# Patient Record
Sex: Female | Born: 1995 | Race: Black or African American | Hispanic: No | Marital: Single | Smoking: Former smoker
Health system: Southern US, Community
[De-identification: ages and names within clinical notes are randomized; demographics above are authoritative.]

## PROBLEM LIST (undated history)

## (undated) DIAGNOSIS — A749 Chlamydial infection, unspecified: Secondary | ICD-10-CM

## (undated) HISTORY — PX: OTHER SURGICAL HISTORY: SHX169

## (undated) HISTORY — DX: Chlamydial infection, unspecified: A74.9

---

## 2017-04-17 ENCOUNTER — Encounter: Payer: Self-pay | Admitting: *Deleted

## 2017-04-18 NOTE — L&D Delivery Note (Signed)
Patient: Rita ShanksDearika Whitsel MRN: [id number]  GBS status: negative  Patient is a 22 y.o. now G1P1001 s/p NSVD at 6371w5d, who was admitted for IOL for mild polyhydramnios and postdates. S/p IOL w/ misoprostol, FB and Pitocin. SROM 12h 1031m prior to delivery with clear fluid.   Delivery Note At 4:33 AM a viable female was delivered via Vaginal, Spontaneous (Presentation: vertex; OA).   APGAR: 4, 9 Weight: 3841g (8lb 7.5oz)   Placenta status: intact, sent to patthology.   Cord:  3-vessel. Cord pH: sent Complications: 60-second shoulder dystocia .    Anesthesia:  Epidural Episiotomy: None Lacerations:  None Suture Repair: none Est. Blood Loss (mL):  200  Mother complete and pushing. Head difficult to deliver despite good maternal effort. Head delivered OA with assistance delivering infant's chin. No nuchal cord present. Shoulders difficult to deliver. Attempt was made to deliver posterior arm; unable to deliver, but could grab axilla and help rotate shoulders. Rubins II then used to further rotate shoulder. Anterior shoulder then visualized and delivered slowly. Body was still difficult to deliver. Anterior axilla held to help deliver posterior shoulder. Then infant grabbed by both axillas to help deliver body, which still came slowly. Cord clamped and cut immediately, and infant placed in warmer under care of neonatal team. Infant noted to have weak cry on the way to the warmer. Cord blood drawn. Placenta delivered spontaneously with gentle cord traction. Fundus firm with massage and Pitocin. Perineum inspected and found to have no lacerations.  Mom to postpartum.  Baby to Couplet care / Skin to Skin.  Raynelle FanningJulie P. Anuhea Gassner, MD OB Fellow 10/11/17, 5:05 AM

## 2017-04-26 ENCOUNTER — Encounter: Payer: Self-pay | Admitting: Advanced Practice Midwife

## 2017-04-26 ENCOUNTER — Ambulatory Visit: Payer: Self-pay | Admitting: *Deleted

## 2017-05-29 ENCOUNTER — Encounter: Payer: Self-pay | Admitting: *Deleted

## 2017-06-01 ENCOUNTER — Ambulatory Visit: Payer: Medicaid Other | Admitting: *Deleted

## 2017-06-01 ENCOUNTER — Ambulatory Visit (INDEPENDENT_AMBULATORY_CARE_PROVIDER_SITE_OTHER): Payer: Medicaid Other | Admitting: Advanced Practice Midwife

## 2017-06-01 ENCOUNTER — Encounter: Payer: Self-pay | Admitting: Advanced Practice Midwife

## 2017-06-01 ENCOUNTER — Encounter (INDEPENDENT_AMBULATORY_CARE_PROVIDER_SITE_OTHER): Payer: Self-pay

## 2017-06-01 VITALS — BP 110/60 | HR 60 | Ht 63.0 in | Wt 138.0 lb

## 2017-06-01 DIAGNOSIS — Z3A21 21 weeks gestation of pregnancy: Secondary | ICD-10-CM | POA: Diagnosis not present

## 2017-06-01 DIAGNOSIS — Z1389 Encounter for screening for other disorder: Secondary | ICD-10-CM

## 2017-06-01 DIAGNOSIS — Z331 Pregnant state, incidental: Secondary | ICD-10-CM

## 2017-06-01 DIAGNOSIS — Z3402 Encounter for supervision of normal first pregnancy, second trimester: Secondary | ICD-10-CM

## 2017-06-01 DIAGNOSIS — Z3400 Encounter for supervision of normal first pregnancy, unspecified trimester: Secondary | ICD-10-CM | POA: Insufficient documentation

## 2017-06-01 LAB — POCT URINALYSIS DIPSTICK
GLUCOSE UA: NEGATIVE
Ketones, UA: NEGATIVE
LEUKOCYTES UA: NEGATIVE
Nitrite, UA: NEGATIVE
Protein, UA: NEGATIVE
RBC UA: NEGATIVE

## 2017-06-01 NOTE — Progress Notes (Signed)
  Subjective:    Rita ShanksDearika Allen is a G1P0 7129w6d being seen today for her first obstetrical visit She has gotten care in [place] and [place].  Her obstetrical history is significant for first baby.  Pregnancy history fully reviewed.  Patient reports no complaints.  Vitals:   06/01/17 1604 06/01/17 1605  BP: 110/60   Pulse: 60   Weight: 138 lb (62.6 kg)   Height:  5\' 3"  (1.6 m)    HISTORY: OB History  Gravida Para Term Preterm AB Living  1            SAB TAB Ectopic Multiple Live Births               # Outcome Date GA Lbr Len/2nd Weight Sex Delivery Anes PTL Lv  1 Current              Past Medical History:  Diagnosis Date  . Chlamydia    Past Surgical History:  Procedure Laterality Date  . right foot surgery     Family History  Problem Relation Age of Onset  . Diabetes Mother   . Hypertension Mother      Exam                                      System:     Skin: normal coloration and turgor, no rashes    Neurologic: oriented, normal, normal mood   Extremities: normal strength, tone, and muscle mass   HEENT PERRLA   Mouth/Teeth mucous membranes moist, normal dentition   Neck supple and no masses   Cardiovascular: regular rate and rhythm   Respiratory:  appears well, vitals normal, no respiratory distress, acyanotic   Abdomen: soft, non-tender;  FHR: 150        The nature of [facility] with multiple MDs and other Advanced Practice Providers was explained to patient; also emphasized that residents, students are part of our team.  Assessment:    Pregnancy: G1P0 Patient Active Problem List   Diagnosis Date Noted  . Supervision of normal first pregnancy 06/01/2017        Plan:     Initial labs drawn. Continue prenatal vitamins  Problem list reviewed and updated  Reviewed n/v relief measures and warning s/s to report  Reviewed recommended weight gain based on pre-gravid BMI  Encouraged well-balanced  diet Genetic Screening discussed says was told it waqs normal, records requested: Marland Kitchen.  Ultrasound discussed; fetal survey: requested.  Return for asap for anatomy scan and 3 weeks for LROB.  Jacklyn ShellFrances Cresenzo-Dishmon 06/02/2017

## 2017-06-02 ENCOUNTER — Other Ambulatory Visit (HOSPITAL_COMMUNITY): Payer: Self-pay | Admitting: Advanced Practice Midwife

## 2017-06-02 DIAGNOSIS — Z363 Encounter for antenatal screening for malformations: Secondary | ICD-10-CM

## 2017-06-02 LAB — OBSTETRIC PANEL, INCLUDING HIV
ANTIBODY SCREEN: NEGATIVE
BASOS: 0 %
Basophils Absolute: 0 10*3/uL (ref 0.0–0.2)
EOS (ABSOLUTE): 0.1 10*3/uL (ref 0.0–0.4)
EOS: 1 %
HEMOGLOBIN: 11.9 g/dL (ref 11.1–15.9)
HEP B S AG: NEGATIVE
HIV Screen 4th Generation wRfx: NONREACTIVE
Hematocrit: 34.9 % (ref 34.0–46.6)
IMMATURE GRANS (ABS): 0 10*3/uL (ref 0.0–0.1)
Immature Granulocytes: 1 %
LYMPHS: 17 %
Lymphocytes Absolute: 1.5 10*3/uL (ref 0.7–3.1)
MCH: 34.8 pg — ABNORMAL HIGH (ref 26.6–33.0)
MCHC: 34.1 g/dL (ref 31.5–35.7)
MCV: 102 fL — ABNORMAL HIGH (ref 79–97)
MONOCYTES: 11 %
MONOS ABS: 0.9 10*3/uL (ref 0.1–0.9)
Neutrophils Absolute: 6 10*3/uL (ref 1.4–7.0)
Neutrophils: 70 %
Platelets: 261 10*3/uL (ref 150–379)
RBC: 3.42 x10E6/uL — AB (ref 3.77–5.28)
RDW: 13.6 % (ref 12.3–15.4)
RH TYPE: POSITIVE
RPR Ser Ql: NONREACTIVE
Rubella Antibodies, IGG: <0.9 index — ABNORMAL LOW (ref >0.99)
WBC: 8.6 10*3/uL (ref 3.4–10.8)

## 2017-06-02 LAB — PMP SCREEN PROFILE (10S), URINE
AMPHETAMINE SCREEN URINE: NEGATIVE ng/mL
BARBITURATE SCREEN URINE: NEGATIVE ng/mL
BENZODIAZEPINE SCREEN, URINE: NEGATIVE ng/mL
CANNABINOIDS UR QL SCN: NEGATIVE ng/mL
COCAINE(METAB.)SCREEN, URINE: NEGATIVE ng/mL
Creatinine(Crt), U: 111.6 mg/dL (ref 20.0–300.0)
Methadone Screen, Urine: NEGATIVE ng/mL
OXYCODONE+OXYMORPHONE UR QL SCN: NEGATIVE ng/mL
Opiate Scrn, Ur: NEGATIVE ng/mL
PH UR, DRUG SCRN: 6.8 (ref 4.5–8.9)
PHENCYCLIDINE QUANTITATIVE URINE: NEGATIVE ng/mL
Propoxyphene Scrn, Ur: NEGATIVE ng/mL

## 2017-06-02 LAB — URINALYSIS, ROUTINE W REFLEX MICROSCOPIC
Bilirubin, UA: NEGATIVE
GLUCOSE, UA: NEGATIVE
Ketones, UA: NEGATIVE
Leukocytes, UA: NEGATIVE
NITRITE UA: NEGATIVE
Protein, UA: NEGATIVE
RBC, UA: NEGATIVE
Specific Gravity, UA: 1.02 (ref 1.005–1.030)
UUROB: 1 mg/dL (ref 0.2–1.0)
pH, UA: 7 (ref 5.0–7.5)

## 2017-06-02 LAB — GC/CHLAMYDIA PROBE AMP
CHLAMYDIA, DNA PROBE: NEGATIVE
NEISSERIA GONORRHOEAE BY PCR: NEGATIVE

## 2017-06-02 LAB — SICKLE CELL SCREEN: SICKLE CELL SCREEN: NEGATIVE

## 2017-06-05 ENCOUNTER — Other Ambulatory Visit: Payer: Medicaid Other

## 2017-06-05 LAB — URINE CULTURE

## 2017-06-06 ENCOUNTER — Ambulatory Visit (INDEPENDENT_AMBULATORY_CARE_PROVIDER_SITE_OTHER): Payer: Medicaid Other

## 2017-06-06 DIAGNOSIS — Z363 Encounter for antenatal screening for malformations: Secondary | ICD-10-CM | POA: Diagnosis not present

## 2017-06-06 DIAGNOSIS — Z3402 Encounter for supervision of normal first pregnancy, second trimester: Secondary | ICD-10-CM

## 2017-06-06 NOTE — Progress Notes (Signed)
US 22+4 wks,cephalic,cx length 3.3 cm,anterior pl gr 0,normal ovaries bilat,svp of fluid 5.9 cm,fhr 141 bpm,EFW 474 g,anatomy complete,no obvious abnormalities

## 2017-06-07 ENCOUNTER — Other Ambulatory Visit: Payer: Self-pay | Admitting: Advanced Practice Midwife

## 2017-06-07 ENCOUNTER — Telehealth: Payer: Self-pay | Admitting: *Deleted

## 2017-06-07 MED ORDER — NITROFURANTOIN MONOHYD MACRO 100 MG PO CAPS: 100.0000 mg | ORAL_CAPSULE | Freq: Two times a day (BID) | ORAL | 0 refills | Status: DC

## 2017-06-07 NOTE — Telephone Encounter (Signed)
Patient informed urine grew bacteria so Drenda FreezeFran sent prescription for antibiotic. Advised patient to take all of the antibiotic even if she is not having any symptoms. Pt verbalized understanding.

## 2017-06-07 NOTE — Progress Notes (Unsigned)
macrobid for ASB 

## 2017-06-09 LAB — CYSTIC FIBROSIS MUTATION 97: GENE DIS ANAL CARRIER INTERP BLD/T-IMP: NOT DETECTED

## 2017-06-22 ENCOUNTER — Encounter: Payer: Self-pay | Admitting: Women's Health

## 2017-06-22 ENCOUNTER — Encounter: Payer: Medicaid Other | Admitting: Women's Health

## 2017-06-22 ENCOUNTER — Ambulatory Visit (INDEPENDENT_AMBULATORY_CARE_PROVIDER_SITE_OTHER): Payer: Medicaid Other | Admitting: Women's Health

## 2017-06-22 VITALS — BP 100/60 | HR 90 | Wt 132.6 lb

## 2017-06-22 DIAGNOSIS — Z3402 Encounter for supervision of normal first pregnancy, second trimester: Secondary | ICD-10-CM

## 2017-06-22 DIAGNOSIS — Z331 Pregnant state, incidental: Secondary | ICD-10-CM

## 2017-06-22 DIAGNOSIS — Z1389 Encounter for screening for other disorder: Secondary | ICD-10-CM

## 2017-06-22 DIAGNOSIS — Z3A24 24 weeks gestation of pregnancy: Secondary | ICD-10-CM

## 2017-06-22 LAB — POCT URINALYSIS DIPSTICK
GLUCOSE UA: NEGATIVE
Ketones, UA: NEGATIVE
Leukocytes, UA: NEGATIVE
Nitrite, UA: NEGATIVE
Protein, UA: NEGATIVE
RBC UA: NEGATIVE

## 2017-06-22 NOTE — Patient Instructions (Signed)
Rita Allen, I greatly value your feedback.  If you receive a survey following your visit with us today, we appreciate you taking the time to fill it out.  Thanks, [name] Alainah Phang, [name], WHNP-BC   You will have your sugar test next visit.  Please do not eat or drink anything after midnight the night before you come, not even water.  You will be here for at least two hours.     Call the office [phone]) or go to [facility] if:  You begin to have strong, frequent contractions  Your water breaks.  Sometimes it is a big gush of fluid, sometimes it is just a trickle that keeps getting your panties wet or running down your legs  You have vaginal bleeding.  It is normal to have a small amount of spotting if your cervix was checked.   You don't feel your baby moving like normal.  If you don't, get you something to eat and drink and lay down and focus on feeling your baby move.   If your baby is still not moving like normal, you should call the office or go to [facility].  Second Trimester of Pregnancy The second trimester is from week 13 through week 28, months 4 through 6. The second trimester is often a time when you feel your best. Your body has also adjusted to being pregnant, and you begin to feel better physically. Usually, morning sickness has lessened or quit completely, you may have more energy, and you may have an increase in appetite. The second trimester is also a time when the fetus is growing rapidly. At the end of the sixth month, the fetus is about 9 inches long and weighs about 1 pounds. You will likely begin to feel the baby move (quickening) between 18 and 20 weeks of the pregnancy. BODY CHANGES Your body goes through many changes during pregnancy. The changes vary from woman to woman.   Your weight will continue to increase. You will notice your lower abdomen bulging out.  You may begin to get stretch marks on your hips, abdomen, and breasts.  You may develop  headaches that can be relieved by medicines approved by your health care provider.  You may urinate more often because the fetus is pressing on your bladder.  You may develop or continue to have heartburn as a result of your pregnancy.  You may develop constipation because certain hormones are causing the muscles that push waste through your intestines to slow down.  You may develop hemorrhoids or swollen, bulging veins (varicose veins).  You may have back pain because of the weight gain and pregnancy hormones relaxing your joints between the bones in your pelvis and as a result of a shift in weight and the muscles that support your balance.  Your breasts will continue to grow and be tender.  Your gums may bleed and may be sensitive to brushing and flossing.  Dark spots or blotches (chloasma, mask of pregnancy) may develop on your face. This will likely fade after the baby is born.  A dark line from your belly button to the pubic area (linea nigra) may appear. This will likely fade after the baby is born.  You may have changes in your hair. These can include thickening of your hair, rapid growth, and changes in texture. Some women also have hair loss during or after pregnancy, or hair that feels dry or thin. Your hair will most likely return to normal after your baby  is born. WHAT TO EXPECT AT YOUR PRENATAL VISITS During a routine prenatal visit:  You will be weighed to make sure you and the fetus are growing normally.  Your blood pressure will be taken.  Your abdomen will be measured to track your baby's growth.  The fetal heartbeat will be listened to.  Any test results from the previous visit will be discussed. Your health care provider may ask you:  How you are feeling.  If you are feeling the baby move.  If you have had any abnormal symptoms, such as leaking fluid, bleeding, severe headaches, or abdominal cramping.  If you have any questions. Other tests that may be  performed during your second trimester include:  Blood tests that check for:  Low iron levels (anemia).  Gestational diabetes (between 24 and 28 weeks).  Rh antibodies.  Urine tests to check for infections, diabetes, or protein in the urine.  An ultrasound to confirm the proper growth and development of the baby.  An amniocentesis to check for possible genetic problems.  Fetal screens for spina bifida and Down syndrome. HOME CARE INSTRUCTIONS   Avoid all smoking, herbs, alcohol, and unprescribed drugs. These chemicals affect the formation and growth of the baby.  Follow your health care provider's instructions regarding medicine use. There are medicines that are either safe or unsafe to take during pregnancy.  Exercise only as directed by your health care provider. Experiencing uterine cramps is a good sign to stop exercising.  Continue to eat regular, healthy meals.  Wear a good support bra for breast tenderness.  Do not use hot tubs, steam rooms, or saunas.  Wear your seat belt at all times when driving.  Avoid raw meat, uncooked cheese, cat litter boxes, and soil used by cats. These carry germs that can cause birth defects in the baby.  Take your prenatal vitamins.  Try taking a stool softener (if your health care provider approves) if you develop constipation. Eat more high-fiber foods, such as fresh vegetables or fruit and whole grains. Drink plenty of fluids to keep your urine clear or pale yellow.  Take warm sitz baths to soothe any pain or discomfort caused by hemorrhoids. Use hemorrhoid cream if your health care provider approves.  If you develop varicose veins, wear support hose. Elevate your feet for 15 minutes, 3-4 times a day. Limit salt in your diet.  Avoid heavy lifting, wear low heel shoes, and practice good posture.  Rest with your legs elevated if you have leg cramps or low back pain.  Visit your dentist if you have not gone yet during your pregnancy.  Use a soft toothbrush to brush your teeth and be gentle when you floss.  A sexual relationship may be continued unless your health care provider directs you otherwise.  Continue to go to all your prenatal visits as directed by your health care provider. SEEK MEDICAL CARE IF:   You have dizziness.  You have mild pelvic cramps, pelvic pressure, or nagging pain in the abdominal area.  You have persistent nausea, vomiting, or diarrhea.  You have a bad smelling vaginal discharge.  You have pain with urination. SEEK IMMEDIATE MEDICAL CARE IF:   You have a fever.  You are leaking fluid from your vagina.  You have spotting or bleeding from your vagina.  You have severe abdominal cramping or pain.  You have rapid weight gain or loss.  You have shortness of breath with chest pain.  You notice sudden or extreme swelling  of your face, hands, ankles, feet, or legs.  You have not felt your baby move in over an hour.  You have severe headaches that do not go away with medicine.  You have vision changes. Document Released: 03/29/2001 Document Revised: 04/09/2013 Document Reviewed: 06/05/2012 [facility] Patient Information 2015 [place], [place]. This information is not intended to replace advice given to you by your health care provider. Make sure you discuss any questions you have with your health care provider.

## 2017-06-22 NOTE — Progress Notes (Signed)
   LOW-RISK PREGNANCY VISIT Patient name: Rita ShanksDearika Schaffert MRN [id number]  Date of birth: October 27, 1995 Chief Complaint:   Routine Prenatal Visit  History of Present Illness:   Rita ShanksDearika Wands is a 22 y.o. G1P0 female at 3161w6d with an Estimated Date of Delivery: 10/06/17 being seen today for ongoing management of a low-risk pregnancy.  Today she reports no complaints.  .  .  Movement: Present. denies leaking of fluid. Review of Systems:   Pertinent items are noted in HPI Denies abnormal vaginal discharge w/ itching/odor/irritation, headaches, visual changes, shortness of breath, chest pain, abdominal pain, severe nausea/vomiting, or problems with urination or bowel movements unless otherwise stated above. Pertinent History Reviewed:  Reviewed past medical,surgical, social, obstetrical and family history.  Reviewed problem list, medications and allergies. Physical Assessment:   Vitals:   06/22/17 0944  BP: 100/60  Pulse: 90  Weight: 132 lb 9.6 oz (60.1 kg)  Body mass index is 23.49 kg/m.        Physical Examination:   General appearance: Well appearing, and in no distress  Mental status: Alert, oriented to person, place, and time  Skin: Warm & dry  Cardiovascular: Normal heart rate noted  Respiratory: Normal respiratory effort, no distress  Abdomen: Soft, gravid, nontender  Pelvic: Cervical exam deferred         Extremities: Edema: None  Fetal Status: Fetal Heart Rate (bpm): 140 Fundal Height: 24 cm Movement: Present    Results for orders placed or performed in visit on 06/22/17 (from the past 24 hour(s))  POCT urinalysis dipstick   Collection Time: 06/22/17  9:49 AM  Result Value Ref Range   Color, UA     Clarity, UA     Glucose, UA neg    Bilirubin, UA     Ketones, UA neg    Spec Grav, UA  1.010 - 1.025   Blood, UA neg    pH, UA  5.0 - 8.0   Protein, UA neg    Urobilinogen, UA  0.2 or 1.0 E.U./dL   Nitrite, UA neg    Leukocytes, UA Negative Negative   Appearance     Odor      Assessment & Plan:  1) Low-risk pregnancy G1P0 at 5361w6d with an Estimated Date of Delivery: 10/06/17    Meds: No orders of the defined types were placed in this encounter.  Labs/procedures today: none  Plan:  Continue routine obstetrical care   Reviewed: Preterm labor symptoms and general obstetric precautions including but not limited to vaginal bleeding, contractions, leaking of fluid and fetal movement were reviewed in detail with the patient.  All questions were answered  Follow-up: Return in about 4 weeks (around 07/20/2017) for LROB, PN2.  Orders Placed This Encounter  Procedures  . POCT urinalysis dipstick   Cheral MarkerKimberly R Rakayla Ricklefs [name], [facility]NP-BC 06/22/2017 10:07 AM

## 2017-07-20 ENCOUNTER — Encounter: Payer: Self-pay | Admitting: Women's Health

## 2017-07-20 ENCOUNTER — Ambulatory Visit (INDEPENDENT_AMBULATORY_CARE_PROVIDER_SITE_OTHER): Payer: Medicaid Other | Admitting: Women's Health

## 2017-07-20 ENCOUNTER — Other Ambulatory Visit: Payer: Medicaid Other

## 2017-07-20 VITALS — BP 120/60 | HR 96 | Wt 140.8 lb

## 2017-07-20 DIAGNOSIS — Z131 Encounter for screening for diabetes mellitus: Secondary | ICD-10-CM

## 2017-07-20 DIAGNOSIS — K219 Gastro-esophageal reflux disease without esophagitis: Secondary | ICD-10-CM

## 2017-07-20 DIAGNOSIS — Z1389 Encounter for screening for other disorder: Secondary | ICD-10-CM

## 2017-07-20 DIAGNOSIS — Z331 Pregnant state, incidental: Secondary | ICD-10-CM

## 2017-07-20 DIAGNOSIS — Z3403 Encounter for supervision of normal first pregnancy, third trimester: Secondary | ICD-10-CM

## 2017-07-20 DIAGNOSIS — O99612 Diseases of the digestive system complicating pregnancy, second trimester: Secondary | ICD-10-CM

## 2017-07-20 DIAGNOSIS — Z3A28 28 weeks gestation of pregnancy: Secondary | ICD-10-CM

## 2017-07-20 LAB — POCT URINALYSIS DIPSTICK
Blood, UA: NEGATIVE
Glucose, UA: NEGATIVE
Ketones, UA: NEGATIVE
LEUKOCYTES UA: NEGATIVE
NITRITE UA: NEGATIVE
PROTEIN UA: NEGATIVE

## 2017-07-20 NOTE — Patient Instructions (Addendum)
Rita Allen, I greatly value your feedback.  If you receive a survey following your visit with [place] today, we appreciate you taking the time to fill it out.  Thanks, [name], [name], WHNP-BC   Call the office [phone]) or go to [facility] if:  You begin to have strong, frequent contractions  Your water breaks.  Sometimes it is a big gush of fluid, sometimes it is just a trickle that keeps getting your panties wet or running down your legs  You have vaginal bleeding.  It is normal to have a small amount of spotting if your cervix was checked.   You don't feel your baby moving like normal.  If you don't, get you something to eat and drink and lay down and focus on feeling your baby move.  You should feel at least 10 movements in 2 hours.  If you don't, you should call the office or go to [facility].    Tdap Vaccine  It is recommended that you get the Tdap vaccine during the third trimester of EACH pregnancy to help protect your baby from getting pertussis (whooping cough)  27-36 weeks is the BEST time to do this so that you can pass the protection on to your baby. During pregnancy is better than after pregnancy, but if you are unable to get it during pregnancy it will be offered at the hospital.   You can get this vaccine at the health department or your family doctor  Everyone who will be around your baby should also be up-to-date on their vaccines. Adults (who are not pregnant) only need 1 dose of Tdap during adulthood.   Third Trimester of Pregnancy The third trimester is from week 29 through week 42, months 7 through 9. The third trimester is a time when the fetus is growing rapidly. At the end of the ninth month, the fetus is about 20 inches in length and weighs 6-10 pounds.  BODY CHANGES Your body goes through many changes during pregnancy. The changes vary from woman to woman.   Your weight will continue to increase. You can expect to gain 25-35 pounds (11-16 kg) by  the end of the pregnancy.  You may begin to get stretch marks on your hips, abdomen, and breasts.  You may urinate more often because the fetus is moving lower into your pelvis and pressing on your bladder.  You may develop or continue to have heartburn as a result of your pregnancy.  You may develop constipation because certain hormones are causing the muscles that push waste through your intestines to slow down.  You may develop hemorrhoids or swollen, bulging veins (varicose veins).  You may have pelvic pain because of the weight gain and pregnancy hormones relaxing your joints between the bones in your pelvis. Backaches may result from overexertion of the muscles supporting your posture.  You may have changes in your hair. These can include thickening of your hair, rapid growth, and changes in texture. Some women also have hair loss during or after pregnancy, or hair that feels dry or thin. Your hair will most likely return to normal after your baby is born.  Your breasts will continue to grow and be tender. A yellow discharge may leak from your breasts called colostrum.  Your belly button may stick out.  You may feel short of breath because of your expanding uterus.  You may notice the fetus "dropping," or moving lower in your abdomen.  You may have a bloody mucus  discharge. This usually occurs a few days to a week before labor begins.  Your cervix becomes thin and soft (effaced) near your due date. WHAT TO EXPECT AT YOUR PRENATAL EXAMS  You will have prenatal exams every 2 weeks until week 36. Then, you will have weekly prenatal exams. During a routine prenatal visit:  You will be weighed to make sure you and the fetus are growing normally.  Your blood pressure is taken.  Your abdomen will be measured to track your baby's growth.  The fetal heartbeat will be listened to.  Any test results from the previous visit will be discussed.  You may have a cervical check near your  due date to see if you have effaced. At around 36 weeks, your caregiver will check your cervix. At the same time, your caregiver will also perform a test on the secretions of the vaginal tissue. This test is to determine if a type of bacteria, Group B streptococcus, is present. Your caregiver will explain this further. Your caregiver may ask you:  What your birth plan is.  How you are feeling.  If you are feeling the baby move.  If you have had any abnormal symptoms, such as leaking fluid, bleeding, severe headaches, or abdominal cramping.  If you have any questions. Other tests or screenings that may be performed during your third trimester include:  Blood tests that check for low iron levels (anemia).  Fetal testing to check the health, activity level, and growth of the fetus. Testing is done if you have certain medical conditions or if there are problems during the pregnancy. FALSE LABOR You may feel small, irregular contractions that eventually go away. These are called Braxton Hicks contractions, or false labor. Contractions may last for hours, days, or even weeks before true labor sets in. If contractions come at regular intervals, intensify, or become painful, it is best to be seen by your caregiver.  SIGNS OF LABOR   Menstrual-like cramps.  Contractions that are 5 minutes apart or less.  Contractions that start on the top of the uterus and spread down to the lower abdomen and back.  A sense of increased pelvic pressure or back pain.  A watery or bloody mucus discharge that comes from the vagina. If you have any of these signs before the 37th week of pregnancy, call your caregiver right away. You need to go to the hospital to get checked immediately. HOME CARE INSTRUCTIONS   Avoid all smoking, herbs, alcohol, and unprescribed drugs. These chemicals affect the formation and growth of the baby.  Follow your caregiver's instructions regarding medicine use. There are medicines  that are either safe or unsafe to take during pregnancy.  Exercise only as directed by your caregiver. Experiencing uterine cramps is a good sign to stop exercising.  Continue to eat regular, healthy meals.  Wear a good support bra for breast tenderness.  Do not use hot tubs, steam rooms, or saunas.  Wear your seat belt at all times when driving.  Avoid raw meat, uncooked cheese, cat litter boxes, and soil used by cats. These carry germs that can cause birth defects in the baby.  Take your prenatal vitamins.  Try taking a stool softener (if your caregiver approves) if you develop constipation. Eat more high-fiber foods, such as fresh vegetables or fruit and whole grains. Drink plenty of fluids to keep your urine clear or pale yellow.  Take warm sitz baths to soothe any pain or discomfort caused by hemorrhoids. Use  hemorrhoid cream if your caregiver approves.  If you develop varicose veins, wear support hose. Elevate your feet for 15 minutes, 3-4 times a day. Limit salt in your diet.  Avoid heavy lifting, wear low heal shoes, and practice good posture.  Rest a lot with your legs elevated if you have leg cramps or low back pain.  Visit your dentist if you have not gone during your pregnancy. Use a soft toothbrush to brush your teeth and be gentle when you floss.  A sexual relationship may be continued unless your caregiver directs you otherwise.  Do not travel far distances unless it is absolutely necessary and only with the approval of your caregiver.  Take prenatal classes to understand, practice, and ask questions about the labor and delivery.  Make a trial run to the hospital.  Pack your hospital bag.  Prepare the baby's nursery.  Continue to go to all your prenatal visits as directed by your caregiver. SEEK MEDICAL CARE IF:  You are unsure if you are in labor or if your water has broken.  You have dizziness.  You have mild pelvic cramps, pelvic pressure, or nagging  pain in your abdominal area.  You have persistent nausea, vomiting, or diarrhea.  You have a bad smelling vaginal discharge.  You have pain with urination. SEEK IMMEDIATE MEDICAL CARE IF:   You have a fever.  You are leaking fluid from your vagina.  You have spotting or bleeding from your vagina.  You have severe abdominal cramping or pain.  You have rapid weight loss or gain.  You have shortness of breath with chest pain.  You notice sudden or extreme swelling of your face, hands, ankles, feet, or legs.  You have not felt your baby move in over an hour.  You have severe headaches that do not go away with medicine.  You have vision changes. Document Released: 03/29/2001 Document Revised: 04/09/2013 Document Reviewed: 06/05/2012 [facility] Patient Information 2015 [place], [place]. This information is not intended to replace advice given to you by your health care provider. Make sure you discuss any questions you have with your health care provider.   Heartburn During Pregnancy Heartburn is a type of pain or discomfort that can happen in the throat or chest. It is often described as a burning sensation. Heartburn is common during pregnancy because:  A hormone (progesterone) that is released during pregnancy may relax the valve (lower esophageal sphincter, or LES) that separates the esophagus from the stomach. This allows stomach acid to move up into the esophagus, causing heartburn.  The uterus gets larger and pushes up on the stomach, which pushes more acid into the esophagus. This is especially true in the later stages of pregnancy.  Heartburn usually goes away or gets better after giving birth. What are the causes? Heartburn is caused by stomach acid backing up into the esophagus (reflux). Reflux can be triggered by:  Changing hormone levels.  Large meals.  Certain foods and beverages, such as coffee, chocolate, onions, and peppermint.  Exercise.  Increased stomach  acid production.  What increases the risk? You are more likely to experience heartburn during pregnancy if you:  Had heartburn prior to becoming pregnant.  Have been pregnant more than once before.  Are overweight or obese.  The likelihood that you will get heartburn also increases as you get farther along in your pregnancy, especially during the last trimester. What are the signs or symptoms? Symptoms of this condition include:  Burning pain in the  chest or lower throat.  Bitter taste in the mouth.  Coughing.  Problems swallowing.  Vomiting.  Hoarse voice.  Asthma.  Symptoms may get worse when you lie down or bend over. Symptoms are often worse at night. How is this diagnosed? This condition is diagnosed based on:  Your medical history.  Your symptoms.  Blood tests to check for a certain type of bacteria associated with heartburn.  Whether taking heartburn medicine relieves your symptoms.  Examination of the stomach and esophagus using a tube with a light and camera on the end (endoscopy).  How is this treated? Treatment varies depending on how severe your symptoms are. Your health care provider may recommend:  Over-the-counter medicines (antacids or acid reducers) for mild heartburn.  Prescription medicines to decrease stomach acid or to protect your stomach lining.  Certain changes in your diet.  Raising the head of your bed so it is higher than the foot of the bed. This helps prevent stomach acid from backing up into the esophagus when you are lying down.  Follow these instructions at home: Eating and drinking  Do not drink alcohol during your pregnancy.  Identify foods and beverages that make your symptoms worse, and avoid them.  Beverages that you may want to avoid include: ? Coffee and tea (with or without caffeine). ? Energy drinks and sports drinks. ? Carbonated drinks or sodas. ? Citrus fruit juices.  Foods that you may want to avoid  include: ? Chocolate and cocoa. ? Peppermint and mint flavorings. ? Garlic, onions, and horseradish. ? Spicy and acidic foods, including peppers, chili powder, curry powder, vinegar, hot sauces, and barbecue sauce. ? Citrus fruits, such as oranges, lemons, and limes. ? Tomato-based foods, such as red sauce, chili, and salsa. ? Fried and fatty foods, such as donuts, french fries, potato chips, and high-fat dressings. ? High-fat meats, such as hot dogs, cold cuts, sausage, ham, and bacon. ? High-fat dairy items, such as whole milk, butter, and cheese.  Eat small, frequent meals instead of large meals.  Avoid drinking large amounts of liquid with your meals.  Avoid eating meals during the 2-3 hours before bedtime.  Avoid lying down right after you eat.  Do not exercise right after you eat. Medicines  Take over-the-counter and prescription medicines only as told by your health care provider.  Do not take aspirin, ibuprofen, or other NSAIDs unless your health care provider tells you to do that.  You may be instructed to avoid medicines that contain sodium bicarbonate. General instructions  If directed, raise the head of your bed about 6 inches (15 cm) by putting blocks under the legs. Sleeping with more pillows does not effectively relieve heartburn because it only changes the position of your head.  Do not use any products that contain nicotine or tobacco, such as cigarettes and e-cigarettes. If you need help quitting, ask your health care provider.  Wear loose-fitting clothing.  Try to reduce your stress, such as with yoga or meditation. If you need help managing stress, ask your health care provider.  Maintain a healthy weight. If you are overweight, work with your health care provider to safely lose weight.  Keep all follow-up visits as told by your health care provider. This is important. Contact a health care provider if:  You develop new symptoms.  Your symptoms do not  improve with treatment.  You have unexplained weight loss.  You have difficulty swallowing.  You make loud sounds when you breathe (wheeze).  You have a cough that does not go away.  You have frequent heartburn for more than 2 weeks.  You have nausea or vomiting that does not get better with treatment.  You have pain in your abdomen. Get help right away if:  You have severe chest pain that spreads to your arm, neck, or jaw.  You feel sweaty, dizzy, or light-headed.  You have shortness of breath.  You have pain when swallowing.  You vomit, and your vomit looks like blood or coffee grounds.  Your stool is bloody or black. This information is not intended to replace advice given to you by your health care provider. Make sure you discuss any questions you have with your health care provider. Document Released: 04/01/2000 Document Revised: 12/21/2015 Document Reviewed: 12/21/2015 Elsevier Interactive Patient Education  2018 [employer].

## 2017-07-20 NOTE — Progress Notes (Addendum)
   LOW-RISK PREGNANCY VISIT Patient name: Rita Allen MRN [id number]  Date of birth: [birth year] Chief Complaint:   Routine Prenatal Visit  History of Present Illness:   Rita Allen is a 22 y.o. G1P0 female at 8284w6d with an Estimated Date of Delivery: 10/06/17 being seen today for ongoing management of a low-risk pregnancy.  Today she reports some heartburn, TUMS doesn't help, declines meds.  Contractions: Not present.  .  Movement: Present. denies leaking of fluid. Review of Systems:   Pertinent items are noted in HPI Denies abnormal vaginal discharge w/ itching/odor/irritation, headaches, visual changes, shortness of breath, chest pain, abdominal pain, severe nausea/vomiting, or problems with urination or bowel movements unless otherwise stated above. Pertinent History Reviewed:  Reviewed past medical,surgical, social, obstetrical and family history.  Reviewed problem list, medications and allergies. Physical Assessment:   Vitals:   07/20/17 0918  BP: 120/60  Pulse: 96  Weight: 140 lb 12.8 oz (63.9 kg)  Body mass index is 24.94 kg/m.        Physical Examination:   General appearance: Well appearing, and in no distress  Mental status: Alert, oriented to person, place, and time  Skin: Warm & dry  Cardiovascular: Normal heart rate noted  Respiratory: Normal respiratory effort, no distress  Abdomen: Soft, gravid, nontender  Pelvic: Cervical exam deferred         Extremities: Edema: None  Fetal Status: Fetal Heart Rate (bpm): 138 Fundal Height: 27 cm Movement: Present    Results for orders placed or performed in visit on 07/20/17 (from the past 24 hour(s))  POCT urinalysis dipstick   Collection Time: 07/20/17  9:27 AM  Result Value Ref Range   Color, UA     Clarity, UA     Glucose, UA neg    Bilirubin, UA     Ketones, UA neg    Spec Grav, UA  1.010 - 1.025   Blood, UA neg    pH, UA  5.0 - 8.0   Protein, UA neg    Urobilinogen, UA  0.2 or 1.0 E.U./dL   Nitrite, UA  neg    Leukocytes, UA Negative Negative   Appearance     Odor      Assessment & Plan:  1) Low-risk pregnancy G1P0 at 7384w6d with an Estimated Date of Delivery: 10/06/17   2) Reflux> gave printed prevention/relief measures    Meds: No orders of the defined types were placed in this encounter.  Labs/procedures today: pn2  Plan:  Continue routine obstetrical care   Reviewed: Preterm labor symptoms and general obstetric precautions including but not limited to vaginal bleeding, contractions, leaking of fluid and fetal movement were reviewed in detail with the patient.  Recommended Tdap at HD/PCP per CDC guidelines. All questions were answered  Follow-up: Return in about 1 month (around 08/17/2017) for LROB.  Orders Placed This Encounter  Procedures  . POCT urinalysis dipstick   Cheral MarkerKimberly R Asaf Elmquist [name], [facility] Inc[facility]NP-BC 07/20/2017 9:44 AM

## 2017-07-21 LAB — CBC
HEMATOCRIT: 34.5 % (ref 34.0–46.6)
HEMOGLOBIN: 12 g/dL (ref 11.1–15.9)
MCH: 35.3 pg — AB (ref 26.6–33.0)
MCHC: 34.8 g/dL (ref 31.5–35.7)
MCV: 102 fL — ABNORMAL HIGH (ref 79–97)
Platelets: 201 10*3/uL (ref 150–379)
RBC: 3.4 x10E6/uL — ABNORMAL LOW (ref 3.77–5.28)
RDW: 13.3 % (ref 12.3–15.4)
WBC: 9.4 10*3/uL (ref 3.4–10.8)

## 2017-07-21 LAB — GLUCOSE TOLERANCE, 2 HOURS W/ 1HR
GLUCOSE, 1 HOUR: 112 mg/dL (ref 65–179)
GLUCOSE, FASTING: 76 mg/dL (ref 65–91)
Glucose, 2 hour: 116 mg/dL (ref 65–152)

## 2017-07-21 LAB — ANTIBODY SCREEN: Antibody Screen: NEGATIVE

## 2017-07-21 LAB — HIV ANTIBODY (ROUTINE TESTING W REFLEX): HIV Screen 4th Generation wRfx: NONREACTIVE

## 2017-07-21 LAB — RPR: RPR Ser Ql: NONREACTIVE

## 2017-08-03 ENCOUNTER — Telehealth: Payer: Self-pay | Admitting: Obstetrics & Gynecology

## 2017-08-03 MED ORDER — PROMETHAZINE HCL 25 MG PO TABS: 12.5000 mg | ORAL_TABLET | Freq: Four times a day (QID) | ORAL | 0 refills | Status: DC | PRN

## 2017-08-03 NOTE — Telephone Encounter (Signed)
Patient informed Phenergan was sent to pharmacy.

## 2017-08-17 ENCOUNTER — Encounter: Payer: Self-pay | Admitting: Obstetrics and Gynecology

## 2017-08-17 ENCOUNTER — Ambulatory Visit (INDEPENDENT_AMBULATORY_CARE_PROVIDER_SITE_OTHER): Payer: Medicaid Other | Admitting: Obstetrics and Gynecology

## 2017-08-17 VITALS — BP 112/60 | HR 88 | Wt 150.4 lb

## 2017-08-17 DIAGNOSIS — Z331 Pregnant state, incidental: Secondary | ICD-10-CM

## 2017-08-17 DIAGNOSIS — Z3403 Encounter for supervision of normal first pregnancy, third trimester: Secondary | ICD-10-CM

## 2017-08-17 DIAGNOSIS — Z3A32 32 weeks gestation of pregnancy: Secondary | ICD-10-CM

## 2017-08-17 DIAGNOSIS — Z1389 Encounter for screening for other disorder: Secondary | ICD-10-CM

## 2017-08-17 LAB — POCT URINALYSIS DIPSTICK
Blood, UA: NEGATIVE
Glucose, UA: NEGATIVE
Ketones, UA: NEGATIVE
LEUKOCYTES UA: NEGATIVE
NITRITE UA: NEGATIVE
PROTEIN UA: NEGATIVE

## 2017-08-17 NOTE — Patient Instructions (Signed)
(  [phone] is the phone number for Pregnancy Classes or hospital tours at [facility].  ° °You will be referred to  [email or web address]   for more information on childbirth classes   °At this site you may register for classes. You may sign up for a waiting list if classes are full. Please SIGN UP FOR THIS!.   When the waiting list becomes long, sometimes new classes can be added. ° ° ° °

## 2017-08-17 NOTE — Progress Notes (Addendum)
Patient ID: Rita Allen, female   DOB: [birth year], 22 y.o.   MRN: [id number]   LOW-RISK PREGNANCY VISIT Patient name: Rita Allen MRN [id number]  Date of birth: [birth year] Chief Complaint:   Routine Prenatal Visit  History of Present Illness:   Rita Allen is a 22 y.o. G1P0 female at [redacted]w[redacted]d with an Estimated Date of Delivery: 10/06/17 being seen today for ongoing management of a low-risk pregnancy.  Today she reports she had some contractions for a few hours. She stayed overnight in the ED while in [place] she is accompanied by the baby's father, whom she lives with. Contractions: Not present. Vag. Bleeding: None.  Movement: Present. denies leaking of fluid. Review of Systems:   Pertinent items are noted in HPI Denies abnormal vaginal discharge w/ itching/odor/irritation, headaches, visual changes, shortness of breath, chest pain, abdominal pain, severe nausea/vomiting, or problems with urination or bowel movements unless otherwise stated above. Pertinent History Reviewed:  Reviewed past medical,surgical, social, obstetrical and family history.  Reviewed problem list, medications and allergies. Physical Assessment:   Vitals:   08/17/17 0910  BP: 112/60  Pulse: 88  Weight: 150 lb 6.4 oz (68.2 kg)  Body mass index is 26.64 kg/m.        Physical Examination:   General appearance: Well appearing, and in no distress  Mental status: Alert, oriented to person, place, and time  Skin: Warm & dry  Cardiovascular: Normal heart rate noted  Respiratory: Normal respiratory effort, no distress  Abdomen: Soft, gravid, nontender  Pelvic: Cervical exam deferred         Extremities: Edema: None  Fetal Status: Fetal Heart Rate (bpm): 137 Fundal Height: 31 cm Movement: Present     Discussion: 1. Discussed with pt and the baby's father the benefits of child birthing classes, encouraged partner to schedule the classes  At end of discussion, pt had opportunity to ask questions and  has no further questions at this time.   Specific discussion of child birthing classes as noted above. Greater than 50% was spent in counseling and coordination of care with the patient.   Total time greater than: 15 minutes.    Results for orders placed or performed in visit on 08/17/17 (from the past 24 hour(s))  POCT urinalysis dipstick   Collection Time: 08/17/17  9:11 AM  Result Value Ref Range   Color, UA     Clarity, UA     Glucose, UA neg    Bilirubin, UA     Ketones, UA neg    Spec Grav, UA  1.010 - 1.025   Blood, UA neg    pH, UA  5.0 - 8.0   Protein, UA neg    Urobilinogen, UA  0.2 or 1.0 E.U./dL   Nitrite, UA neg    Leukocytes, UA Negative Negative   Appearance     Odor      Assessment & Plan:  1) Low-risk pregnancy G1P0 at [redacted]w[redacted]d with an Estimated Date of Delivery: 10/06/17    Meds: No orders of the defined types were placed in this encounter.  Labs/procedures today:   Plan:  Continue routine obstetrical care   Follow-up: Return in about 2 weeks (around 08/31/2017) for LROB.  Orders Placed This Encounter  Procedures  . POCT urinalysis dipstick   By signing my name below, I, Diona Browner, attest that this documentation has been prepared under the direction and in the presence of Tilda Burrow, MD. Electronically Signed: Diona Browner, Medical Scribe. 08/17/17. 9:49 AM.  I personally performed the services described in this documentation, which was SCRIBED in my presence. The recorded information has been reviewed and considered accurate. It has been edited as necessary during review. Jonnie Kind, MD

## 2017-08-31 ENCOUNTER — Other Ambulatory Visit: Payer: Self-pay

## 2017-08-31 ENCOUNTER — Ambulatory Visit (INDEPENDENT_AMBULATORY_CARE_PROVIDER_SITE_OTHER): Payer: Medicaid Other | Admitting: Advanced Practice Midwife

## 2017-08-31 ENCOUNTER — Encounter: Payer: Self-pay | Admitting: Advanced Practice Midwife

## 2017-08-31 VITALS — BP 112/60 | HR 83 | Wt 150.0 lb

## 2017-08-31 DIAGNOSIS — Z1389 Encounter for screening for other disorder: Secondary | ICD-10-CM

## 2017-08-31 DIAGNOSIS — Z23 Encounter for immunization: Secondary | ICD-10-CM

## 2017-08-31 DIAGNOSIS — Z331 Pregnant state, incidental: Secondary | ICD-10-CM

## 2017-08-31 DIAGNOSIS — Z3403 Encounter for supervision of normal first pregnancy, third trimester: Secondary | ICD-10-CM

## 2017-08-31 DIAGNOSIS — Z3A34 34 weeks gestation of pregnancy: Secondary | ICD-10-CM

## 2017-08-31 LAB — POCT URINALYSIS DIPSTICK
Blood, UA: NEGATIVE
GLUCOSE UA: NEGATIVE
KETONES UA: NEGATIVE
Leukocytes, UA: NEGATIVE
Nitrite, UA: NEGATIVE
Protein, UA: NEGATIVE

## 2017-08-31 NOTE — Progress Notes (Signed)
  G1P0 [redacted]w[redacted]d Estimated Date of Delivery: 10/06/17  Blood pressure 112/60, pulse 83, weight 150 lb (68 kg), last menstrual period 12/30/2016.   BP weight and urine results all reviewed and noted.  Please refer to the obstetrical flow sheet for the fundal height and fetal heart rate documentation:  Patient reports good fetal movement, denies any bleeding and no rupture of membranes symptoms or regular contractions. Patient is without complaints. 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  Also requested note to go out of work.  Aware that I cannot take her out without a medical reason, but says HR said she just needed a note that she was coming out.  Note written stating she is starting maternity leave dt swelling and fatigue.   All questions were answered.   Physical Assessment:   Vitals:   08/31/17 1438  BP: 112/60  Pulse: 83  Weight: 150 lb (68 kg)  Body mass index is 26.57 kg/m.        Physical Examination:   General appearance: Well appearing, and in no distress  Mental status: Alert, oriented to person, place, and time  Skin: Warm & dry  Cardiovascular: Normal heart rate noted  Respiratory: Normal respiratory effort, no distress  Abdomen: Soft, gravid, nontender  Pelvic: Cervical exam deferred         Extremities: Edema: Trace  Fetal Status:     Movement: Present    Results for orders placed or performed in visit on 08/31/17 (from the past 24 hour(s))  POCT urinalysis dipstick   Collection Time: 08/31/17  2:40 PM  Result Value Ref Range   Color, UA     Clarity, UA     Glucose, UA neg    Bilirubin, UA     Ketones, UA neg    Spec Grav, UA  1.010 - 1.025   Blood, UA neg    pH, UA  5.0 - 8.0   Protein, UA neg    Urobilinogen, UA  0.2 or 1.0 E.U./dL   Nitrite, UA neg    Leukocytes, UA Negative Negative   Appearance     Odor       Orders Placed This Encounter  Procedures  . Tdap vaccine greater than or equal to 7yo IM  .  POCT urinalysis dipstick    Plan:  Continued routine obstetrical care,   Return in about 1 week (around 09/07/2017) for LROB.

## 2017-08-31 NOTE — Progress Notes (Signed)
Pt given Tdap Right deltoid without complications.

## 2017-09-07 ENCOUNTER — Ambulatory Visit (INDEPENDENT_AMBULATORY_CARE_PROVIDER_SITE_OTHER): Payer: Medicaid Other | Admitting: Women's Health

## 2017-09-07 ENCOUNTER — Encounter: Payer: Self-pay | Admitting: Women's Health

## 2017-09-07 VITALS — BP 126/70 | HR 85 | Wt 151.8 lb

## 2017-09-07 DIAGNOSIS — Z1389 Encounter for screening for other disorder: Secondary | ICD-10-CM

## 2017-09-07 DIAGNOSIS — Z3A35 35 weeks gestation of pregnancy: Secondary | ICD-10-CM

## 2017-09-07 DIAGNOSIS — Z3403 Encounter for supervision of normal first pregnancy, third trimester: Secondary | ICD-10-CM

## 2017-09-07 DIAGNOSIS — Z331 Pregnant state, incidental: Secondary | ICD-10-CM

## 2017-09-07 LAB — POCT URINALYSIS DIPSTICK
Glucose, UA: NEGATIVE
Ketones, UA: NEGATIVE
LEUKOCYTES UA: NEGATIVE
NITRITE UA: NEGATIVE
Protein, UA: NEGATIVE
RBC UA: NEGATIVE

## 2017-09-07 NOTE — Patient Instructions (Signed)
Rita Allen, I greatly value your feedback.  If you receive a survey following your visit with [place] today, we appreciate you taking the time to fill it out.  Thanks, [name], [name], WHNP-BC   Call the office [phone]) or go to [facility] if:  You begin to have strong, frequent contractions  Your water breaks.  Sometimes it is a big gush of fluid, sometimes it is just a trickle that keeps getting your panties wet or running down your legs  You have vaginal bleeding.  It is normal to have a small amount of spotting if your cervix was checked.   You don't feel your baby moving like normal.  If you don't, get you something to eat and drink and lay down and focus on feeling your baby move.  You should feel at least 10 movements in 2 hours.  If you don't, you should call the office or go to [facility].     Preterm Labor and Birth Information The normal length of a pregnancy is 39-41 weeks. Preterm labor is when labor starts before 37 completed weeks of pregnancy. What are the risk factors for preterm labor? Preterm labor is more likely to occur in women who:  Have certain infections during pregnancy such as a bladder infection, sexually transmitted infection, or infection inside the uterus (chorioamnionitis).  Have a shorter-than-normal cervix.  Have gone into preterm labor before.  Have had surgery on their cervix.  Are younger than age 4 or older than age 65.  Are African American.  Are pregnant with twins or multiple babies (multiple gestation).  Take street drugs or smoke while pregnant.  Do not gain enough weight while pregnant.  Became pregnant shortly after having been pregnant.  What are the symptoms of preterm labor? Symptoms of preterm labor include:  Cramps similar to those that can happen during a menstrual period. The cramps may happen with diarrhea.  Pain in the abdomen or lower back.  Regular uterine contractions that may feel like tightening  of the abdomen.  A feeling of increased pressure in the pelvis.  Increased watery or bloody mucus discharge from the vagina.  Water breaking (ruptured amniotic sac).  Why is it important to recognize signs of preterm labor? It is important to recognize signs of preterm labor because babies who are born prematurely may not be fully developed. This can put them at an increased risk for:  Long-term (chronic) heart and lung problems.  Difficulty immediately after birth with regulating body systems, including blood sugar, body temperature, heart rate, and breathing rate.  Bleeding in the brain.  Cerebral palsy.  Learning difficulties.  Death.  These risks are highest for babies who are born before 34 weeks of pregnancy. How is preterm labor treated? Treatment depends on the length of your pregnancy, your condition, and the health of your baby. It may involve:  Having a stitch (suture) placed in your cervix to prevent your cervix from opening too early (cerclage).  Taking or being given medicines, such as: ? Hormone medicines. These may be given early in pregnancy to help support the pregnancy. ? Medicine to stop contractions. ? Medicines to help mature the baby's lungs. These may be prescribed if the risk of delivery is high. ? Medicines to prevent your baby from developing cerebral palsy.  If the labor happens before 34 weeks of pregnancy, you may need to stay in the hospital. What should I do if I think I am in preterm labor? If you  think that you are going into preterm labor, call your health care provider right away. How can I prevent preterm labor in future pregnancies? To increase your chance of having a full-term pregnancy:  Do not use any tobacco products, such as cigarettes, chewing tobacco, and e-cigarettes. If you need help quitting, ask your health care provider.  Do not use street drugs or medicines that have not been prescribed to you during your pregnancy.  Talk  with your health care provider before taking any herbal supplements, even if you have been taking them regularly.  Make sure you gain a healthy amount of weight during your pregnancy.  Watch for infection. If you think that you might have an infection, get it checked right away.  Make sure to tell your health care provider if you have gone into preterm labor before.  This information is not intended to replace advice given to you by your health care provider. Make sure you discuss any questions you have with your health care provider. Document Released: 06/25/2003 Document Revised: 09/15/2015 Document Reviewed: 08/26/2015 Elsevier Interactive Patient Education  2018 [employer].

## 2017-09-07 NOTE — Progress Notes (Signed)
   LOW-RISK PREGNANCY VISIT Patient name: Rita Allen MRN [id number]  Date of birth: 10-14-1995 Chief Complaint:   Routine Prenatal Visit  History of Present Illness:   Rita Allen is a 22 y.o. G1P0 female at [redacted]w[redacted]d with an Estimated Date of Delivery: 10/06/17 being seen today for ongoing management of a low-risk pregnancy.  Today she reports no complaints. Contractions: Not present. Vag. Bleeding: None.  Movement: Present. denies leaking of fluid. Review of Systems:   Pertinent items are noted in HPI Denies abnormal vaginal discharge w/ itching/odor/irritation, headaches, visual changes, shortness of breath, chest pain, abdominal pain, severe nausea/vomiting, or problems with urination or bowel movements unless otherwise stated above. Pertinent History Reviewed:  Reviewed past medical,surgical, social, obstetrical and family history.  Reviewed problem list, medications and allergies. Physical Assessment:   Vitals:   09/07/17 1434  BP: 126/70  Pulse: 85  Weight: 151 lb 12.8 oz (68.9 kg)  Body mass index is 26.89 kg/m.        Physical Examination:   General appearance: Well appearing, and in no distress  Mental status: Alert, oriented to person, place, and time  Skin: Warm & dry  Cardiovascular: Normal heart rate noted  Respiratory: Normal respiratory effort, no distress  Abdomen: Soft, gravid, nontender  Pelvic: Cervical exam deferred         Extremities: Edema: Trace  Fetal Status: Fetal Heart Rate (bpm): 149 Fundal Height: 34 cm Movement: Present    Results for orders placed or performed in visit on 09/07/17 (from the past 24 hour(s))  POCT Urinalysis Dipstick   Collection Time: 09/07/17  2:38 PM  Result Value Ref Range   Color, UA     Clarity, UA     Glucose, UA Negative Negative   Bilirubin, UA     Ketones, UA neg    Spec Grav, UA  1.010 - 1.025   Blood, UA neg    pH, UA  5.0 - 8.0   Protein, UA Negative Negative   Urobilinogen, UA  0.2 or 1.0 E.U./dL   Nitrite, UA neg    Leukocytes, UA Negative Negative   Appearance     Odor      Assessment & Plan:  1) Low-risk pregnancy G1P0 at [redacted]w[redacted]d with an Estimated Date of Delivery: 10/06/17    Meds: No orders of the defined types were placed in this encounter.  Labs/procedures today: none  Plan:  Continue routine obstetrical care   Reviewed: Preterm labor symptoms and general obstetric precautions including but not limited to vaginal bleeding, contractions, leaking of fluid and fetal movement were reviewed in detail with the patient.  All questions were answered  Follow-up: Return in about 1 week (around 09/14/2017) for LROB. and gbs  Orders Placed This Encounter  Procedures  . POCT Urinalysis Dipstick   Cheral Marker [name], [facility] 09/07/2017 3:11 PM

## 2017-09-14 ENCOUNTER — Encounter: Payer: Self-pay | Admitting: Advanced Practice Midwife

## 2017-09-14 ENCOUNTER — Ambulatory Visit (INDEPENDENT_AMBULATORY_CARE_PROVIDER_SITE_OTHER): Payer: Medicaid Other | Admitting: Advanced Practice Midwife

## 2017-09-14 VITALS — BP 110/70 | HR 80 | Wt 154.5 lb

## 2017-09-14 DIAGNOSIS — Z331 Pregnant state, incidental: Secondary | ICD-10-CM

## 2017-09-14 DIAGNOSIS — Z3A36 36 weeks gestation of pregnancy: Secondary | ICD-10-CM

## 2017-09-14 DIAGNOSIS — Z1389 Encounter for screening for other disorder: Secondary | ICD-10-CM

## 2017-09-14 DIAGNOSIS — Z3403 Encounter for supervision of normal first pregnancy, third trimester: Secondary | ICD-10-CM

## 2017-09-14 LAB — POCT URINALYSIS DIPSTICK
GLUCOSE UA: NEGATIVE
Ketones, UA: NEGATIVE
Nitrite, UA: NEGATIVE
Protein, UA: NEGATIVE
RBC UA: NEGATIVE

## 2017-09-14 NOTE — Progress Notes (Signed)
  G1P0 [redacted]w[redacted]d Estimated Date of Delivery: 10/06/17  Blood pressure 110/70, pulse 80, weight 154 lb 8 oz (70.1 kg), last menstrual period 12/30/2016.   BP weight and urine results all reviewed and noted.  Please refer to the obstetrical flow sheet for the fundal height and fetal heart rate documentation:  Patient reports good fetal movement, denies any bleeding and no rupture of membranes symptoms or regular contractions. Patient is without complaints. All questions were answered.   Physical Assessment:   Vitals:   09/14/17 1456  BP: 110/70  Pulse: 80  Weight: 154 lb 8 oz (70.1 kg)  Body mass index is 27.37 kg/m.        Physical Examination:   General appearance: Well appearing, and in no distress  Mental status: Alert, oriented to person, place, and time  Skin: Warm & dry  Cardiovascular: Normal heart rate noted  Respiratory: Normal respiratory effort, no distress  Abdomen: Soft, gravid, nontender  Pelvic: Cervical exam performed  Dilation: Closed Effacement (%): Thick Station: -2  Extremities: Edema: Trace  Fetal Status: Fetal Heart Rate (bpm): 145 Fundal Height: 36 cm Movement: Present Presentation: Vertex  Results for orders placed or performed in visit on 09/14/17 (from the past 24 hour(s))  POCT urinalysis dipstick   Collection Time: 09/14/17  2:57 PM  Result Value Ref Range   Color, UA     Clarity, UA     Glucose, UA Negative Negative   Bilirubin, UA     Ketones, UA neg    Spec Grav, UA  1.010 - 1.025   Blood, UA neg    pH, UA  5.0 - 8.0   Protein, UA Negative Negative   Urobilinogen, UA  0.2 or 1.0 E.U./dL   Nitrite, UA neg    Leukocytes, UA Trace (A) Negative   Appearance     Odor       Orders Placed This Encounter  Procedures  . GC/Chlamydia Probe Amp  . Strep Gp B NAA  . POCT urinalysis dipstick    Plan:  Continued routine obstetrical care,   Return in about 1 week (around 09/21/2017) for LROB.

## 2017-09-14 NOTE — Patient Instructions (Addendum)
AM I IN LABOR? What is labor? Labor is the work that your body does to birth your baby. Your uterus (the womb) contracts. Your cervix (the mouth of the uterus) opens. You will push your baby out into the world.  What do contractions (labor pains) feel like? When they first start, contractions usually feel like cramps during your period. Sometimes you feel pain in your back. Most often, contractions feel like muscles pulling painfully in your lower belly. At first, the contractions will probably be 15 to 20 minutes apart. They will not feel too painful. As labor goes on, the contractions get stronger, closer together, and more painful.  How do I time the contractions? Time your contractions by counting the number of minutes from the start of one contraction to the start of the next contraction.  What should I do when the contractions start? If it is night and you can sleep, sleep. If it happens during the day, here are some things you can do to take care of yourself at home: ? Walk. If the pains you are having are real labor, walking will make the contractions come faster and harder. If the contractions are not going to continue and be real labor, walking will make the contractions slow down. ? Take a shower or bath. This will help you relax. ? Eat. Labor is a big event. It takes a lot of energy. ? Drink water. Not drinking enough water can cause false labor (contractions that hurt but do not open your cervix). If this is true labor, drinking water will help you have strength to get through your labor. ? Take a nap. Get all the rest you can. ? Get a massage. If your labor is in your back, a strong massage on your lower back may feel very good. Getting a foot massage is always good. ? Don't panic. You can do this. Your body was made for this. You are strong!  When should I go to the hospital or call my health care provider? ? Your contractions have been 5 minutes apart or less for at least 1  hour. ? If several contractions are so painful you cannot walk or talk during one. ? Your bag of waters breaks. (You may have a big gush of water or just water that runs down your legs when you walk.)  Are there other reasons to call my health care provider? Yes, you should call your health care provider or go to the hospital if you start to bleed like you are having a period- blood that soaks your underwear or runs down your legs, if you have sudden severe pain, if your baby has not moved for several hours, or if you are leaking green fluid. The rule is as follows: If you are very concerned about something, call.  Cervical Ripening: May try one or both  Red Raspberry Leaf capsules: two  or  tablets with each meal, 2-3 times a day  Potential Side Effects Of Raspberry Leaf:  Most women do not experience any side effects from drinking raspberry leaf tea. However, nausea and loose stools are possible   Evening Primrose Oil capsules: 3 500 mg capsules daily.  Some of the potential side effects:  Upset stomach  Loose stools or diarrhea  Headaches  Nausea:      [place] Pediatricians/Family Doctors:  [facility] [phone]            [facility] [phone]  [facility] [phone] (usually not accepting new patients unless you have family there already, you are always welcome to call and ask)       [facility] [phone]       [facility] Pediatricians/Family Doctors:   [facility]: [phone]  [facility]: [phone]  [facility]: [phone]  [facility] Doctors:   [facility]: [phone]   [facility]: [phone]  [facility] Doctors:  [facility]: [phone]

## 2017-09-16 LAB — GC/CHLAMYDIA PROBE AMP
CHLAMYDIA, DNA PROBE: NEGATIVE
NEISSERIA GONORRHOEAE BY PCR: NEGATIVE

## 2017-09-16 LAB — STREP GP B NAA: Strep Gp B NAA: NEGATIVE

## 2017-09-21 ENCOUNTER — Ambulatory Visit (INDEPENDENT_AMBULATORY_CARE_PROVIDER_SITE_OTHER): Payer: Medicaid Other | Admitting: Women's Health

## 2017-09-21 ENCOUNTER — Encounter: Payer: Self-pay | Admitting: Women's Health

## 2017-09-21 VITALS — BP 111/66 | HR 75 | Wt 155.4 lb

## 2017-09-21 DIAGNOSIS — Z3403 Encounter for supervision of normal first pregnancy, third trimester: Secondary | ICD-10-CM

## 2017-09-21 DIAGNOSIS — Z331 Pregnant state, incidental: Secondary | ICD-10-CM

## 2017-09-21 DIAGNOSIS — Z3A37 37 weeks gestation of pregnancy: Secondary | ICD-10-CM

## 2017-09-21 DIAGNOSIS — Z1389 Encounter for screening for other disorder: Secondary | ICD-10-CM

## 2017-09-21 LAB — POCT URINALYSIS DIPSTICK
Glucose, UA: NEGATIVE
Ketones, UA: NEGATIVE
LEUKOCYTES UA: NEGATIVE
NITRITE UA: NEGATIVE
PROTEIN UA: NEGATIVE
RBC UA: NEGATIVE

## 2017-09-21 NOTE — Patient Instructions (Signed)
Rita Allen, I greatly value your feedback.  If you receive a survey following your visit with us today, we appreciate you taking the time to fill it out.  Thanks, Rita Allen, [name], WHNP-BC   Call the office [phone]) or go to [facility] if:  You begin to have strong, frequent contractions  Your water breaks.  Sometimes it is a big gush of fluid, sometimes it is just a trickle that keeps getting your panties wet or running down your legs  You have vaginal bleeding.  It is normal to have a small amount of spotting if your cervix was checked.   You don't feel your baby moving like normal.  If you don't, get you something to eat and drink and lay down and focus on feeling your baby move.  You should feel at least 10 movements in 2 hours.  If you don't, you should call the office or go to [facility].     Braxton Hicks Contractions Contractions of the uterus can occur throughout pregnancy, but they are not always a sign that you are in labor. You may have practice contractions called Braxton Hicks contractions. These false labor contractions are sometimes confused with true labor. What are Braxton Hicks contractions? Braxton Hicks contractions are tightening movements that occur in the muscles of the uterus before labor. Unlike true labor contractions, these contractions do not result in opening (dilation) and thinning of the cervix. Toward the end of pregnancy (32-34 weeks), Braxton Hicks contractions can happen more often and may become stronger. These contractions are sometimes difficult to tell apart from true labor because they can be very uncomfortable. You should not feel embarrassed if you go to the hospital with false labor. Sometimes, the only way to tell if you are in true labor is for your health care provider to look for changes in the cervix. The health care provider will do a physical exam and may monitor your contractions. If you are not in true labor, the exam should  show that your cervix is not dilating and your water has not broken. If there are other health problems associated with your pregnancy, it is completely safe for you to be sent home with false labor. You may continue to have Braxton Hicks contractions until you go into true labor. How to tell the difference between true labor and false labor True labor  Contractions last 30-70 seconds.  Contractions become very regular.  Discomfort is usually felt in the top of the uterus, and it spreads to the lower abdomen and low back.  Contractions do not go away with walking.  Contractions usually become more intense and increase in frequency.  The cervix dilates and gets thinner. False labor  Contractions are usually shorter and not as strong as true labor contractions.  Contractions are usually irregular.  Contractions are often felt in the front of the lower abdomen and in the groin.  Contractions may go away when you walk around or change positions while lying down.  Contractions get weaker and are shorter-lasting as time goes on.  The cervix usually does not dilate or become thin. Follow these instructions at home:  Take over-the-counter and prescription medicines only as told by your health care provider.  Keep up with your usual exercises and follow other instructions from your health care provider.  Eat and drink lightly if you think you are going into labor.  If Braxton Hicks contractions are making you uncomfortable: ? Change your position from lying down   or resting to walking, or change from walking to resting. ? Sit and rest in a tub of warm water. ? Drink enough fluid to keep your urine pale yellow. Dehydration may cause these contractions. ? Do slow and deep breathing several times an hour.  Keep all follow-up prenatal visits as told by your health care provider. This is important. Contact a health care provider if:  You have a fever.  You have continuous pain in  your abdomen. Get help right away if:  Your contractions become stronger, more regular, and closer together.  You have fluid leaking or gushing from your vagina.  You pass blood-tinged mucus (bloody show).  You have bleeding from your vagina.  You have low back pain that you never had before.  You feel your baby's head pushing down and causing pelvic pressure.  Your baby is not moving inside you as much as it used to. Summary  Contractions that occur before labor are called Braxton Hicks contractions, false labor, or practice contractions.  Braxton Hicks contractions are usually shorter, weaker, farther apart, and less regular than true labor contractions. True labor contractions usually become progressively stronger and regular and they become more frequent.  Manage discomfort from [facility] contractions by changing position, resting in a warm bath, drinking plenty of water, or practicing deep breathing. This information is not intended to replace advice given to you by your health care provider. Make sure you discuss any questions you have with your health care provider. Document Released: 08/18/2016 Document Revised: 08/18/2016 Document Reviewed: 08/18/2016 Elsevier Interactive Patient Education  2018 [employer].

## 2017-09-21 NOTE — Progress Notes (Signed)
   LOW-RISK PREGNANCY VISIT Patient name: Rita Allen MRN [id number]  Date of birth: 12/08/1995 Chief Complaint:   Routine Prenatal Visit  History of Present Illness:   Rita Allen is a 22 y.o. G1P0 female at 228w6d with an Estimated Date of Delivery: 10/06/17 being seen today for ongoing management of a low-risk pregnancy.  Today she reports no complaints. Contractions: Not present. Vag. Bleeding: None.  Movement: Present. denies leaking of fluid. Review of Systems:   Pertinent items are noted in HPI Denies abnormal vaginal discharge w/ itching/odor/irritation, headaches, visual changes, shortness of breath, chest pain, abdominal pain, severe nausea/vomiting, or problems with urination or bowel movements unless otherwise stated above. Pertinent History Reviewed:  Reviewed past medical,surgical, social, obstetrical and family history.  Reviewed problem list, medications and allergies. Physical Assessment:   Vitals:   09/21/17 1426  BP: 111/66  Pulse: 75  Weight: 155 lb 6.4 oz (70.5 kg)  Body mass index is 27.53 kg/m.        Physical Examination:   General appearance: Well appearing, and in no distress  Mental status: Alert, oriented to person, place, and time  Skin: Warm & dry  Cardiovascular: Normal heart rate noted  Respiratory: Normal respiratory effort, no distress  Abdomen: Soft, gravid, nontender  Pelvic: Cervical exam performed  Dilation: Closed Effacement (%): Thick Station: -2 outer os 2cm, inner os closed  Extremities: Edema: Trace  Fetal Status: Fetal Heart Rate (bpm): 133 Fundal Height: 36 cm Movement: Present Presentation: Vertex  Results for orders placed or performed in visit on 09/21/17 (from the past 24 hour(s))  POCT urinalysis dipstick   Collection Time: 09/21/17  2:27 PM  Result Value Ref Range   Color, UA     Clarity, UA     Glucose, UA Negative Negative   Bilirubin, UA     Ketones, UA neg    Spec Grav, UA  1.010 - 1.025   Blood, UA neg    pH,  UA  5.0 - 8.0   Protein, UA Negative Negative   Urobilinogen, UA  0.2 or 1.0 E.U./dL   Nitrite, UA neg    Leukocytes, UA Negative Negative   Appearance     Odor      Assessment & Plan:  1) Low-risk pregnancy G1P0 at 228w6d with an Estimated Date of Delivery: 10/06/17    Meds: No orders of the defined types were placed in this encounter.  Labs/procedures today: sve  Plan:  Continue routine obstetrical care   Reviewed: Term labor symptoms and general obstetric precautions including but not limited to vaginal bleeding, contractions, leaking of fluid and fetal movement were reviewed in detail with the patient.  All questions were answered  Follow-up: Return in about 1 week (around 09/28/2017) for LROB.  Orders Placed This Encounter  Procedures  . POCT urinalysis dipstick   Cheral MarkerKimberly R Haylee Mcanany [name], [facility]NP-BC 09/21/2017 2:44 PM

## 2017-09-28 ENCOUNTER — Encounter: Payer: Self-pay | Admitting: Women's Health

## 2017-09-28 ENCOUNTER — Ambulatory Visit (INDEPENDENT_AMBULATORY_CARE_PROVIDER_SITE_OTHER): Payer: Medicaid Other | Admitting: Women's Health

## 2017-09-28 VITALS — BP 118/76 | HR 93 | Wt 157.2 lb

## 2017-09-28 DIAGNOSIS — Z3403 Encounter for supervision of normal first pregnancy, third trimester: Secondary | ICD-10-CM

## 2017-09-28 DIAGNOSIS — O99613 Diseases of the digestive system complicating pregnancy, third trimester: Secondary | ICD-10-CM

## 2017-09-28 DIAGNOSIS — Z1389 Encounter for screening for other disorder: Secondary | ICD-10-CM

## 2017-09-28 DIAGNOSIS — K219 Gastro-esophageal reflux disease without esophagitis: Secondary | ICD-10-CM

## 2017-09-28 DIAGNOSIS — Z3A38 38 weeks gestation of pregnancy: Secondary | ICD-10-CM

## 2017-09-28 DIAGNOSIS — Z331 Pregnant state, incidental: Secondary | ICD-10-CM

## 2017-09-28 DIAGNOSIS — Z029 Encounter for administrative examinations, unspecified: Secondary | ICD-10-CM

## 2017-09-28 LAB — POCT URINALYSIS DIPSTICK
GLUCOSE UA: NEGATIVE
KETONES UA: NEGATIVE
Nitrite, UA: NEGATIVE
Protein, UA: NEGATIVE
RBC UA: NEGATIVE

## 2017-09-28 MED ORDER — PANTOPRAZOLE SODIUM 20 MG PO TBEC: 20.0000 mg | DELAYED_RELEASE_TABLET | Freq: Every day | ORAL | 1 refills | Status: DC

## 2017-09-28 NOTE — Patient Instructions (Signed)
Rita Allen, I greatly value your feedback.  If you receive a survey following your visit with us today, we appreciate you taking the time to fill it out.  Thanks, Rita Allen Rita Allen, [name], WHNP-BC   Call the office [phone]) or go to [facility] if:  You begin to have strong, frequent contractions  Your water breaks.  Sometimes it is a big gush of fluid, sometimes it is just a trickle that keeps getting your panties wet or running down your legs  You have vaginal bleeding.  It is normal to have a small amount of spotting if your cervix was checked.   You don't feel your baby moving like normal.  If you don't, get you something to eat and drink and lay down and focus on feeling your baby move.  You should feel at least 10 movements in 2 hours.  If you don't, you should call the office or go to [facility].     [facility] Contractions Contractions of the uterus can occur throughout pregnancy, but they are not always a sign that you are in labor. You may have practice contractions called Braxton Hicks contractions. These false labor contractions are sometimes confused with true labor. What are Rita PeltonBraxton Hicks contractions? Braxton Hicks contractions are tightening movements that occur in the muscles of the uterus before labor. Unlike true labor contractions, these contractions do not result in opening (dilation) and thinning of the cervix. Toward the end of pregnancy (32-34 weeks), Braxton Hicks contractions can happen more often and may become stronger. These contractions are sometimes difficult to tell apart from true labor because they can be very uncomfortable. You should not feel embarrassed if you go to the hospital with false labor. Sometimes, the only way to tell if you are in true labor is for your health care provider to look for changes in the cervix. The health care provider will do a physical exam and may monitor your contractions. If you are not in true labor, the exam should  show that your cervix is not dilating and your water has not broken. If there are other health problems associated with your pregnancy, it is completely safe for you to be sent home with false labor. You may continue to have Braxton Hicks contractions until you go into true labor. How to tell the difference between true labor and false labor True labor  Contractions last 30-70 seconds.  Contractions become very regular.  Discomfort is usually felt in the top of the uterus, and it spreads to the lower abdomen and low back.  Contractions do not go away with walking.  Contractions usually become more intense and increase in frequency.  The cervix dilates and gets thinner. False labor  Contractions are usually shorter and not as strong as true labor contractions.  Contractions are usually irregular.  Contractions are often felt in the front of the lower abdomen and in the groin.  Contractions may go away when you walk around or change positions while lying down.  Contractions get weaker and are shorter-lasting as time goes on.  The cervix usually does not dilate or become thin. Follow these instructions at home:  Take over-the-counter and prescription medicines only as told by your health care provider.  Keep up with your usual exercises and follow other instructions from your health care provider.  Eat and drink lightly if you think you are going into labor.  If Braxton Hicks contractions are making you uncomfortable: ? Change your position from lying down  or resting to walking, or change from walking to resting. ? Sit and rest in a tub of warm water. ? Drink enough fluid to keep your urine pale yellow. Dehydration may cause these contractions. ? Do slow and deep breathing several times an hour.  Keep all follow-up prenatal visits as told by your health care provider. This is important. Contact a health care provider if:  You have a fever.  You have continuous pain in  your abdomen. Get help right away if:  Your contractions become stronger, more regular, and closer together.  You have fluid leaking or gushing from your vagina.  You pass blood-tinged mucus (bloody show).  You have bleeding from your vagina.  You have low back pain that you never had before.  You feel your baby's head pushing down and causing pelvic pressure.  Your baby is not moving inside you as much as it used to. Summary  Contractions that occur before labor are called Braxton Hicks contractions, false labor, or practice contractions.  Braxton Hicks contractions are usually shorter, weaker, farther apart, and less regular than true labor contractions. True labor contractions usually become progressively stronger and regular and they become more frequent.  Manage discomfort from [facility] contractions by changing position, resting in a warm bath, drinking plenty of water, or practicing deep breathing. This information is not intended to replace advice given to you by your health care provider. Make sure you discuss any questions you have with your health care provider. Document Released: 08/18/2016 Document Revised: 08/18/2016 Document Reviewed: 08/18/2016 Elsevier Interactive Patient Education  2018 [employer].

## 2017-09-28 NOTE — Progress Notes (Signed)
   LOW-RISK PREGNANCY VISIT Patient name: Rita ShanksDearika Wint MRN [id number]  Date of birth: 1995/08/20 Chief Complaint:   Routine Prenatal Visit  History of Present Illness:   Rita ShanksDearika Mcbryar is a 22 y.o. G1P0 female at 6876w6d with an Estimated Date of Delivery: 10/06/17 being seen today for ongoing management of a low-risk pregnancy.  Today she reports tired, reflux. Contractions: Not present. Vag. Bleeding: None.  Movement: Present. denies leaking of fluid. Review of Systems:   Pertinent items are noted in HPI Denies abnormal vaginal discharge w/ itching/odor/irritation, headaches, visual changes, shortness of breath, chest pain, abdominal pain, severe nausea/vomiting, or problems with urination or bowel movements unless otherwise stated above. Pertinent History Reviewed:  Reviewed past medical,surgical, social, obstetrical and family history.  Reviewed problem list, medications and allergies. Physical Assessment:   Vitals:   09/28/17 1442  BP: 118/76  Pulse: 93  Weight: 157 lb 3.2 oz (71.3 kg)  Body mass index is 27.85 kg/m.        Physical Examination:   General appearance: Well appearing, and in no distress  Mental status: Alert, oriented to person, place, and time  Skin: Warm & dry  Cardiovascular: Normal heart rate noted  Respiratory: Normal respiratory effort, no distress  Abdomen: Soft, gravid, nontender  Pelvic: Cervical exam performed  Dilation: Closed Effacement (%): Thick Station: -2  Extremities: Edema: Trace  Fetal Status: Fetal Heart Rate (bpm): 140 Fundal Height: 37 cm Movement: Present Presentation: Vertex  Results for orders placed or performed in visit on 09/28/17 (from the past 24 hour(s))  POCT urinalysis dipstick   Collection Time: 09/28/17  2:44 PM  Result Value Ref Range   Color, UA     Clarity, UA     Glucose, UA Negative Negative   Bilirubin, UA     Ketones, UA neg    Spec Grav, UA  1.010 - 1.025   Blood, UA neg    pH, UA  5.0 - 8.0   Protein, UA  Negative Negative   Urobilinogen, UA  0.2 or 1.0 E.U./dL   Nitrite, UA neg    Leukocytes, UA Moderate (2+) (A) Negative   Appearance     Odor      Assessment & Plan:  1) Low-risk pregnancy G1P0 at 3176w6d with an Estimated Date of Delivery: 10/06/17   2) Reflux, rx protonix   Meds:  Meds ordered this encounter  Medications  . pantoprazole (PROTONIX) 20 MG tablet    Sig: Take 1 tablet (20 mg total) by mouth daily.    Dispense:  30 tablet    Refill:  1    Order Specific Question:   Supervising Provider    Answer:   Lazaro ArmsEURE, LUTHER H [[id number]]   Labs/procedures today: sve  Plan:  Continue routine obstetrical care   Reviewed: Term labor symptoms and general obstetric precautions including but not limited to vaginal bleeding, contractions, leaking of fluid and fetal movement were reviewed in detail with the patient.  All questions were answered  Follow-up: Return in about 1 week (around 10/05/2017) for LROB.  Orders Placed This Encounter  Procedures  . POCT urinalysis dipstick   Cheral MarkerKimberly R Onda Kattner [name], [facility]NP-BC 09/28/2017 3:13 PM

## 2017-10-04 ENCOUNTER — Encounter: Payer: Self-pay | Admitting: Advanced Practice Midwife

## 2017-10-04 ENCOUNTER — Other Ambulatory Visit: Payer: Self-pay | Admitting: *Deleted

## 2017-10-04 ENCOUNTER — Ambulatory Visit (INDEPENDENT_AMBULATORY_CARE_PROVIDER_SITE_OTHER): Payer: Medicaid Other | Admitting: Advanced Practice Midwife

## 2017-10-04 VITALS — BP 121/71 | HR 87 | Wt 158.0 lb

## 2017-10-04 DIAGNOSIS — Z3403 Encounter for supervision of normal first pregnancy, third trimester: Secondary | ICD-10-CM

## 2017-10-04 DIAGNOSIS — Z3A39 39 weeks gestation of pregnancy: Secondary | ICD-10-CM | POA: Diagnosis not present

## 2017-10-04 DIAGNOSIS — Z331 Pregnant state, incidental: Secondary | ICD-10-CM

## 2017-10-04 DIAGNOSIS — O480 Post-term pregnancy: Secondary | ICD-10-CM

## 2017-10-04 DIAGNOSIS — Z1389 Encounter for screening for other disorder: Secondary | ICD-10-CM

## 2017-10-04 LAB — POCT URINALYSIS DIPSTICK
Glucose, UA: NEGATIVE
Ketones, UA: NEGATIVE
LEUKOCYTES UA: NEGATIVE
NITRITE UA: NEGATIVE
PROTEIN UA: NEGATIVE
RBC UA: NEGATIVE

## 2017-10-04 NOTE — Progress Notes (Signed)
  G1P0 4610w5d Estimated Date of Delivery: 10/06/17  Blood pressure 121/71, pulse 87, weight 158 lb (71.7 kg), last menstrual period 12/30/2016.   BP weight and urine results all reviewed and noted.  Please refer to the obstetrical flow sheet for the fundal height and fetal heart rate documentation:  Patient reports good fetal movement, denies any bleeding and no rupture of membranes symptoms or regular contractions. Patient is without complaints. All questions were answered.   Physical Assessment:   Vitals:   10/04/17 1157  BP: 121/71  Pulse: 87  Weight: 158 lb (71.7 kg)  Body mass index is 27.99 kg/m.        Physical Examination:   General appearance: Well appearing, and in no distress  Mental status: Alert, oriented to person, place, and time  Skin: Warm & dry  Cardiovascular: Normal heart rate noted  Respiratory: Normal respiratory effort, no distress  Abdomen: Soft, gravid, nontender  Pelvic: Cervical exam deferred         Extremities: Edema: Trace  Fetal Status:     Movement: Present    Results for orders placed or performed in visit on 10/04/17 (from the past 24 hour(s))  POCT urinalysis dipstick   Collection Time: 10/04/17 12:02 PM  Result Value Ref Range   Color, UA     Clarity, UA     Glucose, UA Negative Negative   Bilirubin, UA     Ketones, UA neg    Spec Grav, UA  1.010 - 1.025   Blood, UA neg    pH, UA  5.0 - 8.0   Protein, UA Negative Negative   Urobilinogen, UA  0.2 or 1.0 E.U./dL   Nitrite, UA neg    Leukocytes, UA Negative Negative   Appearance     Odor       Orders Placed This Encounter  Procedures  . US FETAL BPP WO NON STRESS  . POCT urinalysis dipstick    Plan:  Continued routine obstetrical care,   Return in about 5 days (around 10/09/2017) for LROB, US:BPP.

## 2017-10-05 ENCOUNTER — Encounter: Payer: Medicaid Other | Admitting: Obstetrics and Gynecology

## 2017-10-09 ENCOUNTER — Inpatient Hospital Stay (HOSPITAL_COMMUNITY)
Admission: AD | Admit: 2017-10-09 | Discharge: 2017-10-13 | DRG: 806 | Disposition: A | Discharge status: Home / Self Care | Payer: Medicaid Other | Attending: Family Medicine | Admitting: Family Medicine

## 2017-10-09 ENCOUNTER — Other Ambulatory Visit: Payer: Self-pay | Admitting: Family Medicine

## 2017-10-09 ENCOUNTER — Ambulatory Visit (HOSPITAL_COMMUNITY)
Admission: RE | Admit: 2017-10-09 | Discharge: 2017-10-09 | Disposition: A | Discharge status: Home / Self Care | Payer: Medicaid Other | Source: Ambulatory Visit | Attending: Advanced Practice Midwife | Admitting: Advanced Practice Midwife

## 2017-10-09 ENCOUNTER — Other Ambulatory Visit: Payer: Self-pay | Admitting: Advanced Practice Midwife

## 2017-10-09 ENCOUNTER — Encounter (HOSPITAL_COMMUNITY): Payer: Self-pay | Admitting: *Deleted

## 2017-10-09 DIAGNOSIS — O480 Post-term pregnancy: Secondary | ICD-10-CM | POA: Diagnosis present

## 2017-10-09 DIAGNOSIS — O403XX0 Polyhydramnios, third trimester, not applicable or unspecified: Secondary | ICD-10-CM

## 2017-10-09 DIAGNOSIS — D62 Acute posthemorrhagic anemia: Secondary | ICD-10-CM | POA: Diagnosis not present

## 2017-10-09 DIAGNOSIS — O9081 Anemia of the puerperium: Secondary | ICD-10-CM | POA: Diagnosis not present

## 2017-10-09 DIAGNOSIS — Z3A40 40 weeks gestation of pregnancy: Secondary | ICD-10-CM

## 2017-10-09 DIAGNOSIS — Z3403 Encounter for supervision of normal first pregnancy, third trimester: Secondary | ICD-10-CM

## 2017-10-09 LAB — TYPE AND SCREEN
ABO/RH(D): A POS
Antibody Screen: NEGATIVE

## 2017-10-09 LAB — CBC
HEMATOCRIT: 35.6 % — AB (ref 36.0–46.0)
HEMOGLOBIN: 12.2 g/dL (ref 12.0–15.0)
MCH: 33.5 pg (ref 26.0–34.0)
MCHC: 34.3 g/dL (ref 30.0–36.0)
MCV: 97.8 fL (ref 78.0–100.0)
Platelets: 184 10*3/uL (ref 150–400)
RBC: 3.64 MIL/uL — AB (ref 3.87–5.11)
RDW: 12.9 % (ref 11.5–15.5)
WBC: 9.2 10*3/uL (ref 4.0–10.5)

## 2017-10-09 LAB — ABO/RH: ABO/RH(D): A POS

## 2017-10-09 MED ORDER — TERBUTALINE SULFATE 1 MG/ML IJ SOLN
0.2500 mg | Freq: Once | INTRAMUSCULAR | Status: DC | PRN
  Filled 2017-10-09: qty 1

## 2017-10-09 MED ORDER — OXYCODONE-ACETAMINOPHEN 5-325 MG PO TABS: 2.0000 | ORAL_TABLET | ORAL | Status: DC | PRN

## 2017-10-09 MED ORDER — ACETAMINOPHEN 325 MG PO TABS
650.0000 mg | ORAL_TABLET | ORAL | Status: DC | PRN
  Administered 2017-10-10: 650 mg via ORAL
  Filled 2017-10-09: qty 2

## 2017-10-09 MED ORDER — OXYTOCIN 40 UNITS IN LACTATED RINGERS INFUSION - SIMPLE MED
2.5000 [IU]/h | INTRAVENOUS | Status: DC
  Administered 2017-10-11: 2.5 [IU]/h via INTRAVENOUS

## 2017-10-09 MED ORDER — LIDOCAINE HCL (PF) 1 % IJ SOLN
30.0000 mL | INTRAMUSCULAR | Status: DC | PRN
  Filled 2017-10-09: qty 30

## 2017-10-09 MED ORDER — FENTANYL CITRATE (PF) 100 MCG/2ML IJ SOLN
100.0000 ug | INTRAMUSCULAR | Status: DC | PRN
  Administered 2017-10-10 (×2): 100 ug via INTRAVENOUS
  Filled 2017-10-09 (×2): qty 2

## 2017-10-09 MED ORDER — LACTATED RINGERS IV SOLN
INTRAVENOUS | Status: DC
  Administered 2017-10-09 – 2017-10-11 (×3): via INTRAVENOUS

## 2017-10-09 MED ORDER — SOD CITRATE-CITRIC ACID 500-334 MG/5ML PO SOLN: 30.0000 mL | ORAL | Status: DC | PRN

## 2017-10-09 MED ORDER — MISOPROSTOL 25 MCG QUARTER TABLET
25.0000 ug | ORAL_TABLET | ORAL | Status: DC | PRN
  Administered 2017-10-09 (×2): 25 ug via VAGINAL
  Filled 2017-10-09 (×2): qty 1

## 2017-10-09 MED ORDER — FLEET ENEMA 7-19 GM/118ML RE ENEM
1.0000 | ENEMA | RECTAL | Status: DC | PRN
Start: 2017-10-09 — End: 2017-10-11

## 2017-10-09 MED ORDER — OXYCODONE-ACETAMINOPHEN 5-325 MG PO TABS: 1.0000 | ORAL_TABLET | ORAL | Status: DC | PRN

## 2017-10-09 MED ORDER — LACTATED RINGERS IV SOLN: 500.0000 mL | INTRAVENOUS | Status: DC | PRN

## 2017-10-09 MED ORDER — OXYTOCIN BOLUS FROM INFUSION
500.0000 mL | Freq: Once | INTRAVENOUS | Status: AC
  Administered 2017-10-11: 500 mL via INTRAVENOUS
  Administered 2017-10-11: 62.5 mL/h via INTRAVENOUS

## 2017-10-09 MED ORDER — ONDANSETRON HCL 4 MG/2ML IJ SOLN: 4.0000 mg | Freq: Four times a day (QID) | INTRAMUSCULAR | Status: DC | PRN

## 2017-10-09 NOTE — H&P (Signed)
LABOR AND DELIVERY ADMISSION HISTORY AND PHYSICAL NOTE  Rita Allen is a 22 y.o. female G1P0 with IUP at 5585w3d by 9-wk presenting for IOL for mild polyhydramnios noted on BPP for postdates today. She reports positive fetal movement. She denies concrations, leakage of fluid or vaginal bleeding.  [facility] Pregnancy complications:  - Mild polyhydramnios no ultrasound today  Past Medical History: Past Medical History:  Diagnosis Date  . Chlamydia     Past Surgical History: Past Surgical History:  Procedure Laterality Date  . right foot surgery      Obstetrical History: OB History    Gravida  1   Para      Term      Preterm      AB      Living        SAB      TAB      Ectopic      Multiple      Live Births              Social History: Social History   Socioeconomic History  . Marital status: Single    Spouse name: Not on file  . Number of children: Not on file  . Years of education: Not on file  . Highest education level: Not on file  Occupational History  . Not on file  Social Needs  . Financial resource strain: Not on file  . Food insecurity:    Worry: Not on file    Inability: Not on file  . Transportation needs:    Medical: Not on file    Non-medical: Not on file  Tobacco Use  . Smoking status: Never Smoker  . Smokeless tobacco: Never Used  Substance and Sexual Activity  . Alcohol use: No    Frequency: Never  . Drug use: No  . Sexual activity: Yes    Birth control/protection: None  Lifestyle  . Physical activity:    Days per week: Not on file    Minutes per session: Not on file  . Stress: Not on file  Relationships  . Social connections:    Talks on phone: Not on file    Gets together: Not on file    Attends religious service: Not on file    Active member of club or organization: Not on file    Attends meetings of clubs or organizations: Not on file    Relationship status: Not on  file  Other Topics Concern  . Not on file  Social History Narrative  . Not on file    Family History: Family History  Problem Relation Age of Onset  . Diabetes Mother   . Hypertension Mother     Allergies: No Known Allergies  Medications Prior to Admission  Medication Sig Dispense Refill Last Dose  . pantoprazole (PROTONIX) 20 MG tablet Take 1 tablet (20 mg total) by mouth daily. 30 tablet 1 Taking  . Prenatal Vit-Fe Fumarate-FA (PRENATAL VITAMIN PO) Take by mouth daily.    Taking  . promethazine (PHENERGAN) 25 MG tablet Take 0.5-1 tablets (12.5-25 mg total) by mouth every 6 (six) hours as needed for nausea or vomiting. 30 tablet 0 Taking     Review of Systems  All systems reviewed and negative except as stated in HPI  Physical Exam Blood pressure 133/81, pulse 80, temperature 98.2 F (36.8 C), temperature source Oral, resp. rate 20, height 5\' 3"  (1.6 m), weight 158 lb 14.4 oz (72.1 kg),  last menstrual period 12/30/2016. General appearance: alert, oriented, NAD Lungs: normal respiratory effort Heart: regular rate Abdomen: soft, non-tender; gravid, FH appropriate for GA Extremities: No calf swelling or tenderness  SVE: Dilation: Closed Effacement (%): Thick Station: -3 Exam by:: Dr Nira Retort   Presentation: cephalic by U/s today Fetal monitoring: baseline rate 140, moderate variability, +acel, no decel Uterine activity: occasional contraction  Prenatal labs: ABO, Rh: --/--/A POS (06/24 1640) Antibody: PENDING (06/24 1640) Rubella: <0.90 (02/14 1644) RPR: Non Reactive (04/04 0859)  HBsAg: Negative (02/14 1644)  HIV: Non Reactive (04/04 0859)  GC/Chlamydia: negative GBS: Negative (05/30 1600)  2-hr GTT: normal Genetic screening:  Normal patient at another facility Anatomy US: normal girl  Prenatal Transfer Tool  Maternal Diabetes: No Genetic Screening: Normal Maternal Ultrasounds/Referrals: Normal Fetal Ultrasounds or other Referrals:  None Maternal  Substance Abuse:  No Significant Maternal Medications:  None Significant Maternal Lab Results: None  Results for orders placed or performed during the hospital encounter of 10/09/17 (from the past 24 hour(s))  CBC   Collection Time: 10/09/17  4:40 PM  Result Value Ref Range   WBC 9.2 4.0 - 10.5 K/uL   RBC 3.64 (L) 3.87 - 5.11 MIL/uL   Hemoglobin 12.2 12.0 - 15.0 g/dL   HCT 19.1 (L) 47.8 - 29.5 %   MCV 97.8 78.0 - 100.0 fL   MCH 33.5 26.0 - 34.0 pg   MCHC 34.3 30.0 - 36.0 g/dL   RDW 62.1 30.8 - 65.7 %   Platelets 184 150 - 400 K/uL  Type and screen   Collection Time: 10/09/17  4:40 PM  Result Value Ref Range   ABO/RH(D) A POS    Antibody Screen PENDING    Sample Expiration      10/12/2017 Performed at [facility], [address], [place], [place] [address]     Patient Active Problem List   Diagnosis Date Noted  . Polyhydramnios affecting pregnancy in third trimester 10/09/2017  . Supervision of normal first pregnancy 06/01/2017    Assessment: Rita Allen is a 22 y.o. G1P0 at [redacted]w[redacted]d here for IOL d/t mild polyhydramnios (AFI 19).  #Labor: Cytotec placed vaginally for cervical ripening. Plan to place FB when able #FWB: Cat I #ID:  GBS neg #MOF: breast #MOC:undecided #Circ:  N/a (girl)  Rita Allen 10/09/2017, 5:54 PM

## 2017-10-09 NOTE — Progress Notes (Signed)
LABOR PROGRESS NOTE  Rita Allen is a 22 y.o. G1P0 at 722w3d  admitted for IOL for mild polyhydramnios noted on BPP today (AFI 29).   Subjective: Patient doing well, comfortable- reports mild cramping and some contractions but bearable   Objective: BP 126/80   Pulse 81   Temp 98.2 F (36.8 Allen) (Oral)   Resp 17   Ht 5\' 3"  (1.6 m)   Wt 158 lb 14.4 oz (72.1 kg)   LMP 12/30/2016   BMI 28.15 kg/m  or  Vitals:   10/09/17 1637 10/09/17 1751 10/09/17 1851 10/09/17 2003  BP: 133/81 135/76 131/76 126/80  Pulse: 80 84 73 81  Resp: 20 16 20 17   Temp: 98.2 F (36.8 Allen)     TempSrc: Oral     Weight: 158 lb 14.4 oz (72.1 kg)     Height: 5\' 3"  (1.6 m)       Dilation: Closed Effacement (%): Thick Cervical Position: Posterior Station: -3 Presentation: Vertex Exam by:: Dr Nira Retortegele  FHT: baseline rate 135, moderate varibility, +acel, no decel Toco: 5-7/ mild by palpation   Labs: Lab Results  Component Value Date   WBC 9.2 10/09/2017   HGB 12.2 10/09/2017   HCT 35.6 (L) 10/09/2017   MCV 97.8 10/09/2017   PLT 184 10/09/2017    Patient Active Problem List   Diagnosis Date Noted  . Polyhydramnios affecting pregnancy in third trimester 10/09/2017  . Supervision of normal first pregnancy 06/01/2017    Assessment / Plan: 22 y.o. G1P0 at [redacted]w[redacted]d here for IOL for Poly   Labor: Cytotec IOL, possible FB at next cervical examination  Fetal Wellbeing:  Cat I Pain Control:  Medication ordered PRN  Anticipated MOD:  SVD  Rita Allen, Rita Allen, [name] 10/09/2017, 9:43 PM

## 2017-10-10 ENCOUNTER — Inpatient Hospital Stay (HOSPITAL_COMMUNITY): Payer: Medicaid Other | Admitting: Anesthesiology

## 2017-10-10 ENCOUNTER — Encounter: Payer: Medicaid Other | Admitting: Women's Health

## 2017-10-10 LAB — RPR: RPR: NONREACTIVE

## 2017-10-10 MED ORDER — PHENYLEPHRINE 40 MCG/ML (10ML) SYRINGE FOR IV PUSH (FOR BLOOD PRESSURE SUPPORT)
80.0000 ug | PREFILLED_SYRINGE | INTRAVENOUS | Status: DC | PRN
  Filled 2017-10-10: qty 10
  Filled 2017-10-10: qty 5

## 2017-10-10 MED ORDER — DIPHENHYDRAMINE HCL 50 MG/ML IJ SOLN: 12.5000 mg | INTRAMUSCULAR | Status: DC | PRN

## 2017-10-10 MED ORDER — LIDOCAINE HCL (PF) 1 % IJ SOLN
INTRAMUSCULAR | Status: DC | PRN
  Administered 2017-10-10 (×2): 5 mL via EPIDURAL

## 2017-10-10 MED ORDER — OXYTOCIN 40 UNITS IN LACTATED RINGERS INFUSION - SIMPLE MED
1.0000 m[IU]/min | INTRAVENOUS | Status: DC
  Administered 2017-10-10: 2 m[IU]/min via INTRAVENOUS
  Filled 2017-10-10: qty 1000

## 2017-10-10 MED ORDER — PHENYLEPHRINE 40 MCG/ML (10ML) SYRINGE FOR IV PUSH (FOR BLOOD PRESSURE SUPPORT)
80.0000 ug | PREFILLED_SYRINGE | INTRAVENOUS | Status: DC | PRN
  Filled 2017-10-10: qty 5

## 2017-10-10 MED ORDER — EPHEDRINE 5 MG/ML INJ
10.0000 mg | INTRAVENOUS | Status: DC | PRN
  Filled 2017-10-10: qty 2

## 2017-10-10 MED ORDER — MISOPROSTOL 50MCG HALF TABLET
50.0000 ug | ORAL_TABLET | ORAL | Status: DC
  Administered 2017-10-10: 50 ug via BUCCAL
  Administered 2017-10-11: 400 ug via BUCCAL
  Filled 2017-10-10 (×3): qty 1

## 2017-10-10 MED ORDER — ZOLPIDEM TARTRATE 5 MG PO TABS
5.0000 mg | ORAL_TABLET | Freq: Every evening | ORAL | Status: DC | PRN
  Administered 2017-10-10: 5 mg via ORAL
  Filled 2017-10-10: qty 1

## 2017-10-10 MED ORDER — FENTANYL 2.5 MCG/ML BUPIVACAINE 1/10 % EPIDURAL INFUSION (WH - ANES)
14.0000 mL/h | INTRAMUSCULAR | Status: DC | PRN
  Administered 2017-10-10 – 2017-10-11 (×2): 14 mL/h via EPIDURAL
  Filled 2017-10-10 (×2): qty 100

## 2017-10-10 MED ORDER — LACTATED RINGERS IV SOLN
500.0000 mL | Freq: Once | INTRAVENOUS | Status: DC
  Administered 2017-10-11: 125 mL via INTRAVENOUS

## 2017-10-10 NOTE — Progress Notes (Signed)
LABOR PROGRESS NOTE  Rita Allen is a 22 y.o. G1P0 at 7459w4d  admitted for IOL for mild polyhydramnios   Subjective: Patient more comfortable now with epidural, but does report feeling a lot pressure on her bottom  Objective: BP 129/83   Pulse 73   Temp 98.4 F (36.9 C) (Oral)   Resp 18   Ht 5\' 3"  (1.6 m)   Wt 158 lb 14.4 oz (72.1 kg)   LMP 12/30/2016   BMI 28.15 kg/m  or  Vitals:   10/10/17 1701 10/10/17 1731 10/10/17 1801 10/10/17 1831  BP: 123/81 125/85 136/88 129/83  Pulse: 78 80 84 73  Resp: 18 18 18 18   Temp:      TempSrc:      Weight:      Height:        SVE Dilation: 4 Effacement (%): 60 Cervical Position: Posterior Station: -2 Presentation: Vertex Exam by:: Kris HartmannNicole Jones, RN FHT: baseline rate 130, moderate varibility, +acel, no decel Toco: ctx q1.5-3 min  IUPC placed. MVUs ~180   Assessment / Plan: 22 y.o. G1P0 at 6759w4d here for IOL for mild polyhydramnios  Labor: IUPC placed. Continue IV Pitocin Fetal Wellbeing:  Cat I Pain Control:  epidural Anticipated MOD:  SVD  Frederik PearJulie P Chael Urenda, MD 10/10/2017, 7:12 PM

## 2017-10-10 NOTE — Progress Notes (Signed)
LABOR PROGRESS NOTE  Rita Allen is a [age] y.o. G1P0 at 2341w4d  admitted for IOL for Poly   Subjective: Patient comfortable, glad the foley bulb is out- "feels a lot better now that it is out".   Objective: BP 133/65   Pulse 82   Temp 98.1 F (36.7 C) (Oral)   Resp 16   Ht 5\' 3"  (1.6 m)   Wt 158 lb 14.4 oz (72.1 kg)   LMP 12/30/2016   BMI 28.15 kg/m  or  Vitals:   10/10/17 0145 10/10/17 0151 10/10/17 0301 10/10/17 0608  BP: (!) 160/98 (!) 156/97 138/87 133/65  Pulse: 82 75 64 82  Resp: 18 18 17 16   Temp:  98.3 F (36.8 C)  98.1 F (36.7 C)  TempSrc:  Oral  Oral  Weight:      Height:       FB out at 0600- RN did not check the patient due to last cytotec at 0415 Dilation: 1 Effacement (%): 60 Cervical Position: Posterior Station: -2 Presentation: Vertex Exam by:: Rita Allen FHT: baseline rate 130, minimal/moderate varibility, +acel, no decel Toco: 1-3   Labs: Lab Results  Component Value Date   WBC 9.2 10/09/2017   HGB 12.2 10/09/2017   HCT 35.6 (L) 10/09/2017   MCV 97.8 10/09/2017   PLT 184 10/09/2017    Patient Active Problem List   Diagnosis Date Noted  . Polyhydramnios affecting pregnancy in third trimester 10/09/2017  . Supervision of normal first pregnancy 06/01/2017    Assessment / Plan: 22 y.o. G1P0 at 5841w4d here for IOL for Poly   Labor: Pitocin to be started at 0815 four hours after last cytotec  Fetal Wellbeing:  Cat I  Pain Control:  Medications ordered PRN  Anticipated MOD:  SVD  Rita Allen 10/10/2017, 6:44 AM

## 2017-10-10 NOTE — Progress Notes (Signed)
LABOR PROGRESS NOTE  Rita Allen is a 22 y.o. G1P0 at 289w4d  admitted for IOL for mild polyhydramnios   Subjective: Patient doing well. Denies any concerns.   Objective: BP 131/83   Pulse 75   Temp 98.4 F (36.9 C) (Oral)   Resp 18   Ht 5\' 3"  (1.6 m)   Wt 158 lb 14.4 oz (72.1 kg)   LMP 12/30/2016   BMI 28.15 kg/m  or  Vitals:   10/10/17 0608 10/10/17 0800 10/10/17 0900 10/10/17 1209  BP: 133/65 133/66 132/68 131/83  Pulse: 82 74 76 75  Resp: 16 16 16 18   Temp: 98.1 F (36.7 C)  98.2 F (36.8 C) 98.4 F (36.9 C)  TempSrc: Oral   Oral  Weight:      Height:        Last SVE Dilation: 4.5 Effacement (%): 60 Cervical Position: Posterior Station: -3 Presentation: Vertex Exam by:: Marcelino DusterMichelle, RN  FHT: baseline rate 130, moderate varibility, +acel, no decel Toco: ctx q2-5 min   Assessment / Plan: 22 y.o. G1P0 at 9289w4d here for IOL for mild polyhydramnios  Labor: Pitocin contracting q1-2 min this morning, so Pitocin not started then. However, she did not back cervical change, and plan to start pitocin about 2 hours ago, but this was delayed by patient who wanted to wait on family to arrive Pitocin started now. Fetal Wellbeing:  Cat I Pain Control:  Planning on epidural Anticipated MOD:  SVD  Frederik PearJulie P Kyah Buesing, MD 10/10/2017, 2:19 PM

## 2017-10-10 NOTE — Progress Notes (Signed)
Spoke to Dr. Janee Mornhompson, who is in the department and reported vaginal exam, and FHR. No new orders at this time. Continue to monitor and re-check in one hour

## 2017-10-10 NOTE — Anesthesia Procedure Notes (Signed)
Epidural Patient location during procedure: OB  Staffing Anesthesiologist: Candi Profit, MD Performed: anesthesiologist   Preanesthetic Checklist Completed: patient identified, site marked, surgical consent, pre-op evaluation, timeout performed, IV checked, risks and benefits discussed and monitors and equipment checked  Epidural Patient position: sitting Prep: DuraPrep Patient monitoring: heart rate, continuous pulse ox and blood pressure Approach: right paramedian Location: L3-L4 Injection technique: LOR saline  Needle:  Needle type: Tuohy  Needle gauge: 17 G Needle length: 9 cm and 9 Needle insertion depth: 6 cm Catheter type: closed end flexible Catheter size: 20 Guage Catheter at skin depth: 10 cm Test dose: negative  Assessment Events: blood not aspirated, injection not painful, no injection resistance, negative IV test and no paresthesia  Additional Notes Patient identified. Risks/Benefits/Options discussed with patient including but not limited to bleeding, infection, nerve damage, paralysis, failed block, incomplete pain control, headache, blood pressure changes, nausea, vomiting, reactions to medication both or allergic, itching and postpartum back pain. Confirmed with bedside nurse the patient's most recent platelet count. Confirmed with patient that they are not currently taking any anticoagulation, have any bleeding history or any family history of bleeding disorders. Patient expressed understanding and wished to proceed. All questions were answered. Sterile technique was used throughout the entire procedure. Please see nursing notes for vital signs. Test dose was given through epidural needle and negative prior to continuing to dose epidural or start infusion. Warning signs of high block given to the patient including shortness of breath, tingling/numbness in hands, complete motor block, or any concerning symptoms with instructions to call for help. Patient was given  instructions on fall risk and not to get out of bed. All questions and concerns addressed with instructions to call with any issues.     

## 2017-10-10 NOTE — Anesthesia Pain Management Evaluation Note (Signed)
  CRNA Pain Management Visit Note  Patient: Rita ShanksDearika Allen, 22 y.o., female  "Hello I am a member of the anesthesia team at [facility]. We have an anesthesia team available at all times to provide care throughout the hospital, including epidural management and anesthesia for C-section. I don't know your plan for the delivery whether it a natural birth, water birth, IV sedation, nitrous supplementation, doula or epidural, but we want to meet your pain goals."   1.Was your pain managed to your expectations on prior hospitalizations?   No prior hospitalizations  2.What is your expectation for pain management during this hospitalization?     IV pain meds  3.How can we help you reach that goal? Be available  Record the patient's initial score and the patient's pain goal.   Pain: 0  Pain Goal: 5 [facility] wants you to be able to say your pain was always managed very well.  Edison PaceWILKERSON,Nakiya Rallis 10/10/2017

## 2017-10-10 NOTE — Anesthesia Preprocedure Evaluation (Signed)

## 2017-10-10 NOTE — Progress Notes (Signed)
LABOR PROGRESS NOTE  Rita Allen is a 22 y.o. G1P0 at 1831w4d  admitted for IOL for Poly   Subjective: Patient doing well, feeling more cramping and contractions. Does not want pain medication at this time   Objective: BP 136/71   Pulse 72   Temp 98.2 F (36.8 Allen) (Oral)   Resp 16   Ht 5\' 3"  (1.6 m)   Wt 158 lb 14.4 oz (72.1 kg)   LMP 12/30/2016   BMI 28.15 kg/m  or  Vitals:   10/09/17 1851 10/09/17 2003 10/09/17 2149 10/09/17 2302  BP: 131/76 126/80 140/89 136/71  Pulse: 73 81 87 72  Resp: 20 17 16 16   Temp:      TempSrc:      Weight:      Height:        FB placed @0142  Dilation: 1 Effacement (%): 60 Cervical Position: Posterior Station: -2 Presentation: Vertex Exam by:: Rita Allen [name] FHT: baseline rate 140, moderate varibility, +acel, no decel Toco: 2/ moderate by palpation   Labs: Lab Results  Component Value Date   WBC 9.2 10/09/2017   HGB 12.2 10/09/2017   HCT 35.6 (L) 10/09/2017   MCV 97.8 10/09/2017   PLT 184 10/09/2017    Patient Active Problem List   Diagnosis Date Noted  . Polyhydramnios affecting pregnancy in third trimester 10/09/2017  . Supervision of normal first pregnancy 06/01/2017    Assessment / Plan: 22 y.o. G1P0 at 6531w4d here for IOL for Poly   Labor: Foley bulb placed at 0142, continue cytotec until FB comes out and start pitocin  Fetal Wellbeing:  Cat I Pain Control:  Medications ordered PRN  Anticipated MOD:  SVD  Rita Allen, Rita Allen, [name] 10/10/2017, 1:48 AM

## 2017-10-10 NOTE — Progress Notes (Signed)
LABOR PROGRESS NOTE  Dalanie Bascom LevelsFrazier is a 22 y.o. G1P0 at 7351w4d  admitted for IOL for mild polyhydramnios  Subjective: Pt doing well. Reports she feels some contractions, but not painful.   Objective: BP 131/83   Pulse 75   Temp 98.4 F (36.9 C) (Oral)   Resp 18   Ht 5\' 3"  (1.6 m)   Wt 158 lb 14.4 oz (72.1 kg)   LMP 12/30/2016   BMI 28.15 kg/m  or  Vitals:   10/10/17 0608 10/10/17 0800 10/10/17 0900 10/10/17 1209  BP: 133/65 133/66 132/68 131/83  Pulse: 82 74 76 75  Resp: 16 16 16 18   Temp: 98.1 F (36.7 C)  98.2 F (36.8 C) 98.4 F (36.9 C)  TempSrc: Oral   Oral  Weight:      Height:        FB out at 0600  FHT: baseline rate 135, moderate varibility, +acel, no decel Toco: ctx q 1-2 min  Assessment / Plan: 22 y.o. G1P0 at 6051w4d here for IOL for polydramnios  Labor: s/p FB, out at 0600, had last cytotec at [id number]. Plant to start Pitocin now  Fetal Wellbeing:  Cat I Pain Control:  Planning on epidural Anticipated MOD:  SVD  Frederik PearJulie P Degele, MD 10/10/2017, 1:14 PM

## 2017-10-11 ENCOUNTER — Telehealth: Payer: Self-pay | Admitting: *Deleted

## 2017-10-11 ENCOUNTER — Encounter (HOSPITAL_COMMUNITY): Payer: Self-pay

## 2017-10-11 DIAGNOSIS — Z3A40 40 weeks gestation of pregnancy: Secondary | ICD-10-CM

## 2017-10-11 DIAGNOSIS — O480 Post-term pregnancy: Secondary | ICD-10-CM

## 2017-10-11 DIAGNOSIS — O403XX0 Polyhydramnios, third trimester, not applicable or unspecified: Secondary | ICD-10-CM

## 2017-10-11 LAB — CBC
HCT: 24.9 % — ABNORMAL LOW (ref 36.0–46.0)
Hemoglobin: 8.8 g/dL — ABNORMAL LOW (ref 12.0–15.0)
MCH: 34.1 pg — AB (ref 26.0–34.0)
MCHC: 35.3 g/dL (ref 30.0–36.0)
MCV: 96.5 fL (ref 78.0–100.0)
PLATELETS: 142 10*3/uL — AB (ref 150–400)
RBC: 2.58 MIL/uL — AB (ref 3.87–5.11)
RDW: 12.7 % (ref 11.5–15.5)
WBC: 18.5 10*3/uL — ABNORMAL HIGH (ref 4.0–10.5)

## 2017-10-11 MED ORDER — TETANUS-DIPHTH-ACELL PERTUSSIS 5-2.5-18.5 LF-MCG/0.5 IM SUSP: 0.5000 mL | Freq: Once | INTRAMUSCULAR | Status: DC

## 2017-10-11 MED ORDER — ONDANSETRON HCL 4 MG PO TABS: 4.0000 mg | ORAL_TABLET | ORAL | Status: DC | PRN

## 2017-10-11 MED ORDER — SODIUM CHLORIDE 0.9 % IV SOLN
2.0000 g | Freq: Four times a day (QID) | INTRAVENOUS | Status: DC
  Administered 2017-10-11: 2 g via INTRAVENOUS
  Filled 2017-10-11 (×2): qty 2000
  Filled 2017-10-11: qty 2

## 2017-10-11 MED ORDER — KETOROLAC TROMETHAMINE 30 MG/ML IJ SOLN
30.0000 mg | Freq: Once | INTRAMUSCULAR | Status: AC
  Administered 2017-10-11: 30 mg via INTRAVENOUS

## 2017-10-11 MED ORDER — MISOPROSTOL 200 MCG PO TABS
600.0000 ug | ORAL_TABLET | Freq: Once | ORAL | Status: AC
  Administered 2017-10-11: 600 ug via RECTAL

## 2017-10-11 MED ORDER — METHYLERGONOVINE MALEATE 0.2 MG/ML IJ SOLN
0.2000 mg | Freq: Once | INTRAMUSCULAR | Status: AC
  Administered 2017-10-11: 0.2 mg via INTRAMUSCULAR

## 2017-10-11 MED ORDER — DIPHENHYDRAMINE HCL 25 MG PO CAPS: 25.0000 mg | ORAL_CAPSULE | Freq: Four times a day (QID) | ORAL | Status: DC | PRN

## 2017-10-11 MED ORDER — OXYCODONE HCL 5 MG PO TABS
5.0000 mg | ORAL_TABLET | ORAL | Status: DC | PRN
  Administered 2017-10-11: 5 mg via ORAL
  Filled 2017-10-11: qty 1

## 2017-10-11 MED ORDER — METHYLERGONOVINE MALEATE 0.2 MG PO TABS
0.2000 mg | ORAL_TABLET | Freq: Four times a day (QID) | ORAL | Status: AC
  Administered 2017-10-11 – 2017-10-12 (×4): 0.2 mg via ORAL
  Filled 2017-10-11 (×4): qty 1

## 2017-10-11 MED ORDER — GENTAMICIN SULFATE 40 MG/ML IJ SOLN
5.0000 mg/kg | Freq: Once | INTRAVENOUS | Status: AC
  Administered 2017-10-11: 360 mg via INTRAVENOUS
  Filled 2017-10-11: qty 9

## 2017-10-11 MED ORDER — ACETAMINOPHEN 500 MG PO TABS
1000.0000 mg | ORAL_TABLET | Freq: Four times a day (QID) | ORAL | Status: DC | PRN
  Administered 2017-10-11: 1000 mg via ORAL
  Filled 2017-10-11: qty 2

## 2017-10-11 MED ORDER — IBUPROFEN 600 MG PO TABS: 600.0000 mg | ORAL_TABLET | Freq: Four times a day (QID) | ORAL | Status: DC

## 2017-10-11 MED ORDER — ONDANSETRON HCL 4 MG/2ML IJ SOLN: 4.0000 mg | INTRAMUSCULAR | Status: DC | PRN

## 2017-10-11 MED ORDER — SENNOSIDES-DOCUSATE SODIUM 8.6-50 MG PO TABS
2.0000 | ORAL_TABLET | ORAL | Status: DC
  Administered 2017-10-12 (×2): 2 via ORAL
  Filled 2017-10-11 (×2): qty 2

## 2017-10-11 MED ORDER — GENTAMICIN SULFATE 40 MG/ML IJ SOLN
160.0000 mg | Freq: Once | INTRAVENOUS | Status: AC
  Administered 2017-10-11: 160 mg via INTRAVENOUS
  Filled 2017-10-11: qty 4

## 2017-10-11 MED ORDER — WITCH HAZEL-GLYCERIN EX PADS: 1.0000 "application " | MEDICATED_PAD | CUTANEOUS | Status: DC | PRN

## 2017-10-11 MED ORDER — PRENATAL MULTIVITAMIN CH
1.0000 | ORAL_TABLET | Freq: Every day | ORAL | Status: DC
  Administered 2017-10-11 – 2017-10-13 (×3): 1 via ORAL
  Filled 2017-10-11 (×3): qty 1

## 2017-10-11 MED ORDER — GENTAMICIN SULFATE 40 MG/ML IJ SOLN
150.0000 mg | Freq: Three times a day (TID) | INTRAVENOUS | Status: DC
  Filled 2017-10-11: qty 3.75

## 2017-10-11 MED ORDER — SODIUM CHLORIDE 0.9 % IV SOLN
2.0000 g | Freq: Four times a day (QID) | INTRAVENOUS | Status: AC
  Administered 2017-10-11 – 2017-10-12 (×4): 2 g via INTRAVENOUS
  Filled 2017-10-11 (×2): qty 2
  Filled 2017-10-11: qty 2000
  Filled 2017-10-11: qty 2

## 2017-10-11 MED ORDER — MISOPROSTOL 200 MCG PO TABS
ORAL_TABLET | ORAL | Status: AC
  Administered 2017-10-11: 600 ug via RECTAL
  Filled 2017-10-11: qty 5

## 2017-10-11 MED ORDER — KETOROLAC TROMETHAMINE 30 MG/ML IJ SOLN
30.0000 mg | Freq: Once | INTRAMUSCULAR | Status: DC
  Filled 2017-10-11: qty 1

## 2017-10-11 MED ORDER — MISOPROSTOL 200 MCG PO TABS: 400.0000 ug | ORAL_TABLET | Freq: Once | ORAL | Status: AC

## 2017-10-11 MED ORDER — FERROUS SULFATE 325 (65 FE) MG PO TABS
325.0000 mg | ORAL_TABLET | Freq: Two times a day (BID) | ORAL | Status: DC
  Administered 2017-10-12 – 2017-10-13 (×3): 325 mg via ORAL
  Filled 2017-10-11 (×3): qty 1

## 2017-10-11 MED ORDER — ACETAMINOPHEN 325 MG PO TABS
650.0000 mg | ORAL_TABLET | ORAL | Status: DC | PRN
  Filled 2017-10-11: qty 2

## 2017-10-11 MED ORDER — KETOROLAC TROMETHAMINE 30 MG/ML IJ SOLN
30.0000 mg | Freq: Four times a day (QID) | INTRAMUSCULAR | Status: AC
  Administered 2017-10-11 – 2017-10-12 (×3): 30 mg via INTRAVENOUS
  Filled 2017-10-11 (×3): qty 1

## 2017-10-11 MED ORDER — BENZOCAINE-MENTHOL 20-0.5 % EX AERO
1.0000 "application " | INHALATION_SPRAY | CUTANEOUS | Status: DC | PRN
  Administered 2017-10-11: 1 via TOPICAL
  Filled 2017-10-11: qty 56

## 2017-10-11 MED ORDER — METHYLERGONOVINE MALEATE 0.2 MG/ML IJ SOLN
INTRAMUSCULAR | Status: AC
  Administered 2017-10-11: 0.2 mg
  Filled 2017-10-11: qty 1

## 2017-10-11 MED ORDER — IBUPROFEN 600 MG PO TABS
600.0000 mg | ORAL_TABLET | Freq: Four times a day (QID) | ORAL | Status: DC
  Administered 2017-10-12 – 2017-10-13 (×4): 600 mg via ORAL
  Filled 2017-10-11 (×4): qty 1

## 2017-10-11 MED ORDER — OXYCODONE HCL 5 MG PO TABS: 5.0000 mg | ORAL_TABLET | ORAL | Status: DC | PRN

## 2017-10-11 MED ORDER — OXYTOCIN 40 UNITS IN LACTATED RINGERS INFUSION - SIMPLE MED
INTRAVENOUS | Status: AC
  Administered 2017-10-11: 62.5 mL/h via INTRAVENOUS
  Filled 2017-10-11: qty 1000

## 2017-10-11 MED ORDER — DIBUCAINE 1 % RE OINT: 1.0000 "application " | TOPICAL_OINTMENT | RECTAL | Status: DC | PRN

## 2017-10-11 MED ORDER — SIMETHICONE 80 MG PO CHEW: 80.0000 mg | CHEWABLE_TABLET | ORAL | Status: DC | PRN

## 2017-10-11 MED ORDER — ZOLPIDEM TARTRATE 5 MG PO TABS: 5.0000 mg | ORAL_TABLET | Freq: Every evening | ORAL | Status: DC | PRN

## 2017-10-11 MED ORDER — COCONUT OIL OIL: 1.0000 "application " | TOPICAL_OIL | Status: DC | PRN

## 2017-10-11 NOTE — Telephone Encounter (Signed)
Lmom for pt to call us back to schedule pp appt.   10-11-17  AS

## 2017-10-11 NOTE — Progress Notes (Signed)
Spoke to Dr Janee Mornhompson to review patient's pain, previous fever, current temp 99.2, BP122/69, HR 84, RR 18, H/H 8.8/24.9, plt 142,review IV status. Pt states she was not dizzy when ambulating to bathroom. Dr Janee Mornhompson is entering new orders.

## 2017-10-11 NOTE — Progress Notes (Addendum)
LABOR PROGRESS NOTE  Rita Allen is a 22 y.o. G1P0 at 1859w4d  admitted for IOL for mild polyhydramnios   Subjective: Patient comfortable. Denies any concerns  Objective: Patient Vitals for the past 4 hrs:  BP Temp Temp src Pulse Resp  10/11/17 0031 140/73 (!) 101 F (38.3 C) Axillary 99 16  10/11/17 0001 137/72 - - 99 16  10/10/17 2332 - (!) 100.8 F (38.2 C) Axillary - -  10/10/17 2331 128/77 99.8 F (37.7 C) Oral 99 16  10/10/17 2301 124/60 - - 95 16  10/10/17 2231 119/65 99.4 F (37.4 C) Oral 99 16  10/10/17 2201 127/73 - - 92 16  10/10/17 2131 130/80 - - 88 18  10/10/17 2100 122/76 - - 83 16    SVE: Dilation: Lip/rim Effacement (%): 100 Cervical Position: Posterior Station: 0, Plus 1 Presentation: Vertex Exam by:: Rita BersMichelle Kelly, RN FHT: baseline rate 150, moderate varibility, +acel, no decel Toco: MVUs ~180   Assessment / Plan: 22 y.o. G1P0 at 8159w4d here for IOL for mild polyhydramnios.  Fever: temp > 100.3 x 2 an hour apart. Will empirically cover for intraamniotic infection with Ampicillin and Gentamicin   Labor: Progressing well. Stage I almost complete Fetal Wellbeing:  Cat I Pain Control:  epidural Anticipated MOD:  SVD  Frederik PearJulie P Phil Michels, MD 10/11/2017, 12:36 AM

## 2017-10-11 NOTE — Progress Notes (Signed)
Rita Allen 1 hour check at 0730- she was passing small clots and trickling blood. Fundus deviated to the right side and 3 above umbilical. Her pads were completely saturated and heavy. I attempted to get her up to the bathroom to void but, she began to throw up after just sitting at the bedside. She vomited approximately 400cc. I put her back to bed and in and out cathered her for 350cc clear yellow urine. After cath she continued to trickle blood, fundus midline firm and 1 above. MD called at this time to evaluate the patient. Orders obtained for Methergine IM, Cytotec rectal and bucal, Toradol IV, IV fluids started with Pitocin. (See orders) Dr. Nira Retortegele at bedside by this time and removed many large clots. BP=121/76-125/84. P=89-98, RR=18-20, Temp=99.1-102.2 Total blood los weighed at this episode=1075cc.

## 2017-10-11 NOTE — Plan of Care (Signed)
Pt. Condition will continue to improve 

## 2017-10-11 NOTE — Progress Notes (Addendum)
UPDATE NOTE:  Called into room around 0800 d/t persistent bleeding noted by RN.  In to evaluate. Pt in NAD.  On exam, fundus about 1 cm about umbilicus, moderately firm. Bimanual exam done and several large clots were cleared. Cytotec 400 mcg buccal and 600 mcg rectal, and methergine 200 mcg IM given. Continue massage done. Toradol also given for pain.  Bimanual massage done again after a few minutes, and again large clots were removed. Uterine much more firm now, and at the umbilicus.  Order given to also given another bag of Pitocin and 1L LR.   VS remained stable, and pt asymptomatic.  BP 127/76   Pulse 89   Temp (!) 101.7 F (38.7 C)   Resp 18   Ht 5\' 3"  (1.6 m)   Wt 158 lb 14.4 oz (72.1 kg)   LMP 12/30/2016   SpO2 100%   Breastfeeding? Unknown   BMI 28.15 kg/m   A/P: PPH: QBL in the room 1060 ml, total EBL now 1260 mL - S/p methergine IM, Cytotec BU/PR, and Pitocin IV - 1L LR bolus running - Will also schedule po methergine series x 24 hours - Check CBC at 12 pm - Continue to monitor VS closely  Plan discussed with team on rounds this morning  Raynelle FanningJulie P. Degele, MD OB Fellow

## 2017-10-11 NOTE — Anesthesia Postprocedure Evaluation (Signed)
Anesthesia Post Note  Patient: Rita ShanksDearika Faison  Procedure(s) Performed: AN AD HOC LABOR EPIDURAL     Patient location during evaluation: Mother Baby Anesthesia Type: Epidural Level of consciousness: awake and alert Pain management: pain level controlled Vital Signs Assessment: post-procedure vital signs reviewed and stable Respiratory status: spontaneous breathing, nonlabored ventilation and respiratory function stable Cardiovascular status: stable Postop Assessment: no headache, no backache, epidural receding, no apparent nausea or vomiting, able to ambulate, patient able to bend at knees and adequate PO intake Anesthetic complications: no    Last Vitals:  Vitals:   10/11/17 1115 10/11/17 1303  BP: 127/78 (!) 142/85  Pulse: 88 (!) 105  Resp: 18 16  Temp: (!) 38 C (!) 38 C  SpO2:      Last Pain:  Vitals:   10/11/17 1303  TempSrc: Oral  PainSc:    Pain Goal: Patients Stated Pain Goal: 3 (10/10/17 1204)               Cenia Zaragosa Hristova

## 2017-10-11 NOTE — Progress Notes (Signed)
Pharmacy Antibiotic Note  Rita Allen Rita Allen is a 22 y.o. G1P0 at 7160w4d admitted on 10/09/2017 for IOL for postdates and mild polyhydramnios. Pt  now with increased temperature and presumed chorioamnionitis. Pharmacy has been consulted for Gentamicin dosing  Plan: Gentamicin 160 mg IV loading dose; then gentamicin 150 mg IV every 8 hours Therapeutic goal: Gentamicin peaks 6-8 mcg/ml; troughs <1 mcg/ml Monitor serum creatinine per protocol Serum gentamicin levels as indicated  Height: 5\' 3"  (160 cm) Weight: 158 lb 14.4 oz (72.1 kg) IBW/kg (Calculated) : 52.4  Temp (24hrs), Avg:99.1 F (37.3 C), Min:98 F (36.7 C), Max:101 F (38.3 C)  Recent Labs  Lab 10/09/17 1640  WBC 9.2    CrCl cannot be calculated (No order found.).    No Known Allergies  Antimicrobials this admission: Ampicillin 2 Gm IV every 6 hours 10/11/17>>  Dose adjustments this admission: N/A  Microbiology results:  Thank you for allowing pharmacy to be a part of this patient's care.  Arelia SneddonMason, Prakash Kimberling Anne 10/11/2017 12:43 AM

## 2017-10-11 NOTE — Lactation Note (Signed)
This note was copied from a baby's chart. Lactation Consultation Note  Patient Name: Rita Vernie ShanksDearika Mahaney WUJWJ'XToday's Date: 10/11/2017 Reason for consult: Initial assessment;Primapara;Term Breastfeeding consultation services and support information given and reviewed.  Baby is 6 hours old and has had a few attempts at breast.  Baby is currently sleeping in crib.  Mom had a PPH this morning but is stable now.  RN has initiated a breast pump to stimulate milk supply.  Mom will call for assist with next feeding  Maternal Data Has patient been taught Hand Expression?: Yes  Feeding Feeding Type: Breast Fed Length of feed: 0 min  LATCH Score Latch: Too sleepy or reluctant, no latch achieved, no sucking elicited.  Audible Swallowing: None  Type of Nipple: Flat  Comfort (Breast/Nipple): Soft / non-tender  Hold (Positioning): Assistance needed to correctly position infant at breast and maintain latch.  LATCH Score: 4  Interventions    Lactation Tools Discussed/Used     Consult Status Consult Status: Follow-up Date: 10/12/17 Follow-up type: In-patient    Huston FoleyMOULDEN, Amrom Ore S 10/11/2017, 11:11 AM

## 2017-10-12 ENCOUNTER — Other Ambulatory Visit: Payer: Self-pay

## 2017-10-12 ENCOUNTER — Encounter (HOSPITAL_COMMUNITY): Payer: Self-pay

## 2017-10-12 LAB — CBC
HEMATOCRIT: 22.8 % — AB (ref 36.0–46.0)
Hemoglobin: 8.1 g/dL — ABNORMAL LOW (ref 12.0–15.0)
MCH: 33.6 pg (ref 26.0–34.0)
MCHC: 35.5 g/dL (ref 30.0–36.0)
MCV: 94.6 fL (ref 78.0–100.0)
Platelets: 133 10*3/uL — ABNORMAL LOW (ref 150–400)
RBC: 2.41 MIL/uL — ABNORMAL LOW (ref 3.87–5.11)
RDW: 12.6 % (ref 11.5–15.5)
WBC: 16.5 10*3/uL — ABNORMAL HIGH (ref 4.0–10.5)

## 2017-10-12 NOTE — Progress Notes (Addendum)
Post Partum Day 1 Subjective: S/p PPH yesterday. Afebrile o/n. Bleeding improved.  no complaints, up ad lib, voiding, tolerating PO and + flatus  Objective: Blood pressure 115/86, pulse 93, temperature 99.4 F (37.4 C), temperature source Oral, resp. rate 19, height 5\' 3"  (1.6 m), weight 158 lb 14.4 oz (72.1 kg), last menstrual period 12/30/2016, SpO2 99 %, unknown if currently breastfeeding.  Physical Exam:  General: alert, cooperative and no distress Lochia: appropriate Uterine Fundus: firm Incision: n/a DVT Evaluation: No evidence of DVT seen on physical exam.  Recent Labs    10/11/17 1409 10/12/17 0600  HGB 8.8* 8.1*  HCT 24.9* 22.8*    Assessment/Plan: PPH - s/p cytotec and methergine series. Fever most likely side effect of cytotec. WBC down trending 18 > 16. Hg stable at 8.1. Will start oral Iron.   Plan for discharge tomorrow   LOS: 3 days   Rita Allen 10/12/2017, 10:56 AM

## 2017-10-12 NOTE — Plan of Care (Signed)
  Problem: Clinical Measurements: Goal: Ability to maintain clinical measurements within normal limits will improve Outcome: Progressing Goal: Diagnostic test results will improve Outcome: Progressing   Problem: Elimination: Goal: Will not experience complications related to urinary retention Outcome: Progressing   Problem: Pain Managment: Goal: General experience of comfort will improve Outcome: Progressing Note:  Pt has only required scheduled Motrin for pain.  Has been instructed on how to do a sitz bath, but has not yet done one.   Problem: Education: Goal: Knowledge of condition will improve Outcome: Progressing   Problem: Activity: Goal: Will verbalize the importance of balancing activity with adequate rest periods Outcome: Progressing   Problem: Life Cycle: Goal: Chance of risk for complications during the postpartum period will decrease Outcome: Progressing Note:  Ilean ChinaFundas has been firm & midline. Lochia WNL without clots.

## 2017-10-13 MED ORDER — IBUPROFEN 600 MG PO TABS: 600.0000 mg | ORAL_TABLET | Freq: Four times a day (QID) | ORAL | 0 refills | Status: DC

## 2017-10-13 MED ORDER — FERROUS SULFATE 325 (65 FE) MG PO TABS: 325.0000 mg | ORAL_TABLET | Freq: Two times a day (BID) | ORAL | 0 refills | Status: DC

## 2017-10-13 NOTE — Lactation Note (Signed)
This note was copied from a baby's chart. Lactation Consultation Note;  Mother very sleeping when I arrived in the room. FOB holding infant in the bed. He reports that he just fed infant a bottle of formula.  Father reports that infant cluster fed during the night as well as formula fed.   Mother reports that she wants to breast feed. Lots of teaching done . Mother is active with WIC. She has an appt on Monday . Mother was given a harmony hand pump and instruct to pump every 2-3 hours to stimulate milk volume. [facility]. Mother will think about rental.  Mother advised to offer breast as often as infant cues. Informed parents that infant needs to be fed 8-12 times daily. Reinforced mothers need to post pump due to her PPH. Encouraged mother to take in lots of flds .Father taught to pace bottle feed infant.  Discussed treatment and prevention of engorgement.  Mother is aware of available LC services as well as community support.   [facility] with [facility] infant the next feeding.   Patient Name: Rita Vernie ShanksDearika Nunnery ZOXWR'UToday's Date: 10/13/2017 Reason for consult: Follow-up assessment   Maternal Data    Feeding Feeding Type: Bottle Fed - Formula  LATCH Score                   Interventions    Lactation Tools Discussed/Used     Consult Status Consult Status: Follow-up Date: 10/13/17 Follow-up type: In-patient(will follow up for next feeding for assistance)    Michel BickersKendrick, Calleigh Lafontant McCoy 10/13/2017, 12:47 PM

## 2017-10-13 NOTE — Lactation Note (Signed)
This note was copied from a baby's chart. Lactation Consultation Note:  [facility] is just going to bottle feed only.   Patient Name: Rita Vernie ShanksDearika Barclift ZOXWR'UToday's Date: 10/13/2017 Reason for consult: Follow-up assessment   Maternal Data    Feeding Feeding Type: Bottle Fed - Formula  LATCH Score                   Interventions    Lactation Tools Discussed/Used     Consult Status Consult Status: Follow-up Date: 10/13/17 Follow-up type: In-patient(will follow up for next feeding for assistance)    Michel BickersKendrick, Randal Yepiz McCoy 10/13/2017, 12:56 PM

## 2017-10-13 NOTE — Discharge Summary (Signed)
OB Discharge Summary     Patient Name: Rita Allen DOB: 02-06-1996 MRN: [id number]  Date of admission: 10/09/2017 Delivering MD: Frederik Pear   Date of discharge: 10/13/2017  Admitting diagnosis: 40.3 WKS Intrauterine pregnancy: [redacted]w[redacted]d     Secondary diagnosis:  Principal Problem:   SVD (spontaneous vaginal delivery) Active Problems:   Polyhydramnios affecting pregnancy in third trimester   Shoulder dystocia, delivered, current hospitalization   PPH (postpartum hemorrhage)   Maternal fever affecting labor  Additional problems: acute blood loss anemia 2/2 postpartum hemorrhage     Discharge diagnosis: Term Pregnancy Delivered                                                                                                Post partum procedures:none  Augmentation: Pitocin, Cytotec and Foley Balloon  Complications: Hemorrhage>1048mL  Hospital course:  Induction of Labor With Vaginal Delivery   22 y.o. yo G1P1001 at [redacted]w[redacted]d was admitted to the hospital 10/09/2017 for induction of labor.  Indication for induction: posterm and mild polyhydramnios. S/p IOL with FB, misoprostol, and oxytocin.  Patient had an labor course complicated by maternal fever. She was empirically treated for Triple I (placenta pathology did not show chorioamnionitis). Delivery was complicated by a 60-second shoulder dystocia.  Membrane Rupture Time/Date: 3:56 PM ,10/10/2017   Intrapartum Procedures: Episiotomy: None [1]                                         Lacerations:     Patient had delivery of a Viable infant.  Information for the patient's newborn:  Willma, Obando [[id number]]  Delivery Method: Vaginal, Spontaneous(Filed from Delivery Summary)   Details of delivery can be found in separate delivery note. Immediate delivery EBL was 200 mL, but patient had delayed postpartum hemorrhage, with total EBL ~1,300 mL. Hgb went from 12.2 to 8.8, then stabilized at 8.1  She remained asymptomatic. She was  started on po ferrous sulfated and is also discharge on this. Patient is discharged home 10/13/17 in stable condition.   Physical exam  Vitals:   10/11/17 2120 10/12/17 0457 10/12/17 1803 10/13/17 0654  BP: 128/82 115/86 119/81 106/76  Pulse: 72 93 63 63  Resp: 17 19 18    Temp: 98.9 F (37.2 C) 99.4 F (37.4 C) 97.7 F (36.5 C) (!) 97.5 F (36.4 C)  TempSrc: Oral Oral Oral Oral  SpO2: 99%  100% 100%  Weight:      Height:       General: alert, cooperative and no distress Lochia: appropriate Uterine Fundus: firm Incision: N/A DVT Evaluation: No evidence of DVT seen on physical exam. No significant calf/ankle edema. Labs: Lab Results  Component Value Date   WBC 16.5 (H) 10/12/2017   HGB 8.1 (L) 10/12/2017   HCT 22.8 (L) 10/12/2017   MCV 94.6 10/12/2017   PLT 133 (L) 10/12/2017   No flowsheet data found.  Discharge instruction: per After Visit Summary and "Baby and Me Booklet".  After visit meds:  Allergies as of 10/13/2017   No Known Allergies     Medication List    TAKE these medications   ferrous sulfate 325 (65 FE) MG tablet Take 1 tablet (325 mg total) by mouth 2 (two) times daily with a meal.   ibuprofen 600 MG tablet Commonly known as:  ADVIL,MOTRIN Take 1 tablet (600 mg total) by mouth every 6 (six) hours.   pantoprazole 20 MG tablet Commonly known as:  PROTONIX Take 1 tablet (20 mg total) by mouth daily.   prenatal multivitamin Tabs tablet Take 1 tablet by mouth daily at 12 noon.   promethazine 25 MG tablet Commonly known as:  PHENERGAN Take 0.5-1 tablets (12.5-25 mg total) by mouth every 6 (six) hours as needed for nausea or vomiting.       Diet: routine diet  Activity: Advance as tolerated. Pelvic rest for 6 weeks.   Outpatient follow up:4 weeks Follow up Appt: Future Appointments  Date Time Provider Department Center  11/15/2017 10:00 AM Cresenzo-Dishmon, Scarlette CalicoFrances, [name] [facility]   Follow up Visit:No follow-ups on  file.  Postpartum contraception: Undecided; contraceptive methods discussed; handout given  Newborn Data: Live born female  Birth Weight: 8 lb 7.5 oz (3841 g) APGAR: 4, 9  Newborn Delivery   Time head delivered:  10/11/2017 04:33:00 Birth date/time:  10/11/2017 04:33:00 Delivery type:  Vaginal, Spontaneous     Baby Feeding: Bottle and Breast Disposition:home with mother   10/13/2017 Frederik PearJulie P Degele, MD

## 2017-10-13 NOTE — Discharge Instructions (Signed)
Vaginal Delivery, Care After °Refer to this sheet in the next few weeks. These instructions provide you with information about caring for yourself after vaginal delivery. Your health care provider may also give you more specific instructions. Your treatment has been planned according to current medical practices, but problems sometimes occur. Call your health care provider if you have any problems or questions. °What can I expect after the procedure? °After vaginal delivery, it is common to have: °· Some bleeding from your vagina. °· Soreness in your abdomen, your vagina, and the area of skin between your vaginal opening and your anus (perineum). °· Pelvic cramps. °· Fatigue. ° °Follow these instructions at home: °Medicines °· Take over-the-counter and prescription medicines only as told by your health care provider. °· If you were prescribed an antibiotic medicine, take it as told by your health care provider. Do not stop taking the antibiotic until it is finished. °Driving ° °· Do not drive or operate heavy machinery while taking prescription pain medicine. °· Do not drive for 24 hours if you received a sedative. °Lifestyle °· Do not drink alcohol. This is especially important if you are breastfeeding or taking medicine to relieve pain. °· Do not use tobacco products, including cigarettes, chewing tobacco, or e-cigarettes. If you need help quitting, ask your health care provider. °Eating and drinking °· Drink at least 8 eight-ounce glasses of water every day unless you are told not to by your health care provider. If you choose to breastfeed your baby, you may need to drink more water than this. °· Eat high-fiber foods every day. These foods may help prevent or relieve constipation. High-fiber foods include: °? Whole grain cereals and breads. °? Brown rice. °? Beans. °? Fresh fruits and vegetables. °Activity °· Return to your normal activities as told by your health care provider. Ask your health care provider  what activities are safe for you. °· Rest as much as possible. Try to rest or take a nap when your baby is sleeping. °· Do not lift anything that is heavier than your baby or 10 lb (4.5 kg) until your health care provider says that it is safe. °· Talk with your health care provider about when you can engage in sexual activity. This may depend on your: °? Risk of infection. °? Rate of healing. °? Comfort and desire to engage in sexual activity. °Vaginal Care °· If you have an episiotomy or a vaginal tear, check the area every day for signs of infection. Check for: °? More redness, swelling, or pain. °? More fluid or blood. °? Warmth. °? Pus or a bad smell. °· Do not use tampons or douches until your health care provider says this is safe. °· Watch for any blood clots that may pass from your vagina. These may look like clumps of dark red, brown, or black discharge. °General instructions °· Keep your perineum clean and dry as told by your health care provider. °· Wear loose, comfortable clothing. °· Wipe from front to back when you use the toilet. °· Ask your health care provider if you can shower or take a bath. If you had an episiotomy or a perineal tear during labor and delivery, your health care provider may tell you not to take baths for a certain length of time. °· Wear a bra that supports your breasts and fits you well. °· If possible, have someone help you with household activities and help care for your baby for at least a few days after   you leave the hospital. °· Keep all follow-up visits for you and your baby as told by your health care provider. This is important. °Contact a health care provider if: °· You have: °? Vaginal discharge that has a bad smell. °? Difficulty urinating. °? Pain when urinating. °? A sudden increase or decrease in the frequency of your bowel movements. °? More redness, swelling, or pain around your episiotomy or vaginal tear. °? More fluid or blood coming from your episiotomy or  vaginal tear. °? Pus or a bad smell coming from your episiotomy or vaginal tear. °? A fever. °? A rash. °? Little or no interest in activities you used to enjoy. °? Questions about caring for yourself or your baby. °· Your episiotomy or vaginal tear feels warm to the touch. °· Your episiotomy or vaginal tear is separating or does not appear to be healing. °· Your breasts are painful, hard, or turn red. °· You feel unusually sad or worried. °· You feel nauseous or you vomit. °· You pass large blood clots from your vagina. If you pass a blood clot from your vagina, save it to show to your health care provider. Do not flush blood clots down the toilet without having your health care provider look at them. °· You urinate more than usual. °· You are dizzy or light-headed. °· You have not breastfed at all and you have not had a menstrual period for 12 weeks after delivery. °· You have stopped breastfeeding and you have not had a menstrual period for 12 weeks after you stopped breastfeeding. °Get help right away if: °· You have: °? Pain that does not go away or does not get better with medicine. °? Chest pain. °? Difficulty breathing. °? Blurred vision or spots in your vision. °? Thoughts about hurting yourself or your baby. °· You develop pain in your abdomen or in one of your legs. °· You develop a severe headache. °· You faint. °· You bleed from your vagina so much that you fill two sanitary pads in one hour. °This information is not intended to replace advice given to you by your health care provider. Make sure you discuss any questions you have with your health care provider. °Document Released: 04/01/2000 Document Revised: 09/16/2015 Document Reviewed: 04/19/2015 °Elsevier Interactive Patient Education © 2018 [employer] ° °Contraception Choices °Contraception, also called birth control, means things to use or ways to try not to get pregnant. °Hormonal birth control °This kind of birth control uses hormones. Here  are some types of hormonal birth control: °· A tube that is put under skin of the arm (implant). The tube can stay in for as long as 3 years. °· Shots to get every 3 months (injections). °· Pills to take every day (birth control pills). °· A patch to change 1 time each week for 3 weeks (birth control patch). After that, the patch is taken off for 1 week. °· A ring to put in the vagina. The ring is left in for 3 weeks. Then it is taken out of the vagina for 1 week. Then a new ring is put in. °· Pills to take after unprotected sex (emergency birth control pills). ° °Barrier birth control °Here are some types of barrier birth control: °· A thin covering that is put on the penis before sex (female condom). The covering is thrown away after sex. °· A soft, loose covering that is put in the vagina before sex (female condom). The covering is thrown   away after sex. °· A rubber bowl that sits over the cervix (diaphragm). The bowl must be made for you. The bowl is put into the vagina before sex. The bowl is left in for 6-8 hours after sex. It is taken out within 24 hours. °· A small, soft cup that fits over the cervix (cervical cap). The cup must be made for you. The cup can be left in for 6-8 hours after sex. It is taken out within 48 hours. °· A sponge that is put into the vagina before sex. It must be left in for at least 6 hours after sex. It must be taken out within 30 hours. Then it is thrown away. °· A chemical that kills or stops sperm from getting into the uterus (spermicide). It may be a pill, cream, jelly, or foam to put in the vagina. The chemical should be used at least 10-15 minutes before sex. ° °IUD (intrauterine) birth control °An IUD is a small, T-shaped piece of plastic. It is put inside the uterus. There are two kinds: °· Hormone IUD. This kind can stay in for 3-5 years. °· Copper IUD. This kind can stay in for 10 years. ° °Permanent birth control °Here are some types of permanent birth control: °· Surgery  to block the fallopian tubes. °· Having an insert put into each fallopian tube. °· Surgery to tie off the tubes that carry sperm (vasectomy). ° °Natural planning birth control °Here are some types of natural planning birth control: °· Not having sex on the days the woman could get pregnant. °· Using a calendar: °? To keep track of the length of each period. °? To find out what days pregnancy can happen. °? To plan to not have sex on days when pregnancy can happen. °· Watching for symptoms of ovulation and not having sex during ovulation. One way the woman can check for ovulation is to check her temperature. °· Waiting to have sex until after ovulation. ° °Summary °· Contraception, also called birth control, means things to use or ways to try not to get pregnant. °· Hormonal methods of birth control include implants, injections, pills, patches, vaginal rings, and emergency birth control pills. °· Barrier methods of birth control can include female condoms, female condoms, diaphragms, cervical caps, sponges, and spermicides. °· There are two types of IUD (intrauterine device) birth control. An IUD can be put in a woman's uterus to prevent pregnancy for 3-5 years. °· Permanent sterilization can be done through a procedure for males, females, or both. °· Natural planning methods involve not having sex on the days when the woman could get pregnant. °This information is not intended to replace advice given to you by your health care provider. Make sure you discuss any questions you have with your health care provider. °Document Released: 01/30/2009 Document Revised: 04/14/2016 Document Reviewed: 04/14/2016 °Elsevier Interactive Patient Education © 2017 [employer] ° °

## 2017-11-15 ENCOUNTER — Other Ambulatory Visit: Payer: Self-pay

## 2017-11-15 ENCOUNTER — Encounter: Payer: Self-pay | Admitting: Advanced Practice Midwife

## 2017-11-15 ENCOUNTER — Ambulatory Visit (INDEPENDENT_AMBULATORY_CARE_PROVIDER_SITE_OTHER): Payer: Medicaid Other | Admitting: Advanced Practice Midwife

## 2017-11-15 DIAGNOSIS — Z862 Personal history of diseases of the blood and blood-forming organs and certain disorders involving the immune mechanism: Secondary | ICD-10-CM

## 2017-11-15 DIAGNOSIS — Z3202 Encounter for pregnancy test, result negative: Secondary | ICD-10-CM

## 2017-11-15 LAB — POCT HEMOGLOBIN: Hemoglobin: 11.3 g/dL — AB (ref 12.2–16.2)

## 2017-11-15 LAB — POCT URINE PREGNANCY: Preg Test, Ur: NEGATIVE

## 2017-11-15 NOTE — Progress Notes (Signed)
Rita Allen is a 22 y.o. who presents for a postpartum visit. She is 4 weeks postpartum following a spontaneous vaginal delivery. I have fully reviewed the prenatal and intrapartum course. The delivery was at 40.5 gestational weeks. She had a SD and a pph (no tx), and maternal fever. Anesthesia: epidural. Postpartum course has been uneventful. Baby's course has been uneventful. Baby is feeding by bottle. Bleeding: staining only. Bowel function is normal. Bladder function is normal. Patient is sexually active. Contraception method is none.  Knows someone who had complications from Nexplanon, so doesn't want to do anything. Aware that she is fertile and could get pregnant. Will probalby use condomsPostpartum depression screening: negative.   Current Outpatient Medications:  .  ferrous sulfate 325 (65 FE) MG tablet, Take 1 tablet (325 mg total) by mouth 2 (two) times daily with a meal. (Patient not taking: Reported on 11/15/2017), Disp: 60 tablet, Rfl: 0  Review of Systems   Constitutional: Negative for fever and chills Eyes: Negative for visual disturbances Respiratory: Negative for shortness of breath, dyspnea Cardiovascular: Negative for chest pain or palpitations  Gastrointestinal: Negative for vomiting, diarrhea and constipation Genitourinary: Negative for dysuria and urgency Musculoskeletal: Negative for back pain, joint pain, myalgias  Neurological: Negative for dizziness and headaches    Objective:     Vitals:   11/15/17 1014  BP: 116/83  Pulse: 62   General:  alert, cooperative and no distress   Breasts:  negative  Lungs: Normal respiratory effort  Heart:  regular rate and rhythm  Abdomen: Soft, nontender   Vulva:  normal  Vagina: normal vagina  Cervix:  closed  Corpus: Well involuted     Rectal Exam: no hemorrhoids        Assessment:    normal postpartum exam.  Plan:   1. Contraception: condoms 2. Follow up in:   or as needed.

## 2017-11-24 ENCOUNTER — Telehealth: Payer: Self-pay | Admitting: Obstetrics & Gynecology

## 2017-11-24 NOTE — Telephone Encounter (Signed)
Pt called requesting a note stating that she could return to work. Advised that I would get the note written for her.

## 2018-04-18 NOTE — L&D Delivery Note (Addendum)
[facility] Delivery Note  Rita Allen is a 23 y.o. G2P1001 s/p induced vaginal at [redacted]w[redacted]d. She was admitted for Rupture of Membranes .   GBS Status: Negative (05/26 0000) Maximum Maternal Temperature: Temp (24hrs), Avg:98.9 F (37.2 C), Min:98.2 F (36.8 C), Max:100.7 F (38.2 C)  Labor Progress: . Admitted in after SROM 27h 59m prior to delivery with clear fluid  . Pitocin started . Epidural placed . IUPC placed . Pitocin decreased to 46mu/min from 63mu/min due to tachysystole  . FHR with variable decels - amniofusion . Pitocin down to 2 mu/min . Maternal fever, treated at triple I, s/p amp/gent . Pitocin increased to 8 mu/min, amniofusion dc'ed . Completed dilation at 1155.   Delivery Date/Time: 10/05/2018 at 1542 Delivery: Called to room and patient was complete and pushing. Patient pushed for 1 hour and 52 minutes with difficulty delivery head. Ultimately, head delivered OA with no further descent of baby. Delivery was complicated by shoulder dystocia. Shoulder dystocia was diagnosed based on prolonged pushing while crowning. Several maneuvers were done to relieve dystocia. McRoberts, suprapubic pressure, sidelying (maternal left), Gaskins, and then posterior arm delivered. The time elapsed from delivery of the head to delivery of the body was 2  Minutes and 5 seconds. No nuchal cord present. Infant with poor tone at delivery and cord cut immediately by Dr. Maudie Mercury and baby to warmer with NICU team.  Attempts to obtain arterial cord blood failed and venous cord blood was obtained. Placenta delivered at 1610 with gentle traction. Fundus firm with massage and Pitocin. Labia, perineum, vagina, and cervix inspected with no lacerations.    Placenta: spontaneous , intact  Complications: maternal temperature, triple I, ROM > 24 hours Lacerations: n/a EBL: 764 mL  Analgesia: Epidural anesthesia  Postpartum Planning Mom to postpartum.  Baby to NICU. Marland Kitchen Lactation consult . AM  CBC . Contraception IUD o/p  . Circ yes, inpatient   . Social work - stressful delivery and baby to NICU   Infant: Viable female  APGAR: 2/8  4280g  Rita Allen, M.D.  10/05/2018 4:36 PM  I attest that I was present and gloved for this delivery and I agree with the above documentation and findings. See Dr. Jordan Hawks note about shoulder dystocia.  Rita Allen

## 2018-04-23 ENCOUNTER — Other Ambulatory Visit: Payer: Self-pay | Admitting: Obstetrics & Gynecology

## 2018-04-23 DIAGNOSIS — Z363 Encounter for antenatal screening for malformations: Secondary | ICD-10-CM

## 2018-04-24 ENCOUNTER — Other Ambulatory Visit: Payer: Self-pay | Admitting: Obstetrics & Gynecology

## 2018-04-24 ENCOUNTER — Ambulatory Visit (INDEPENDENT_AMBULATORY_CARE_PROVIDER_SITE_OTHER): Payer: Medicaid Other

## 2018-04-24 DIAGNOSIS — Z363 Encounter for antenatal screening for malformations: Secondary | ICD-10-CM

## 2018-04-24 DIAGNOSIS — O3680X0 Pregnancy with inconclusive fetal viability, not applicable or unspecified: Secondary | ICD-10-CM

## 2018-04-24 NOTE — Progress Notes (Signed)
[place] 16+3 wks,single IUP,anterior placenta gr 0,cx 3.8 cm,svp of fluid 5 cm,normal ovaries bilat,efw 158 g,fhr 142 bpm,please have pt come back for additional images of the spine

## 2018-04-26 ENCOUNTER — Ambulatory Visit: Payer: Medicaid Other | Admitting: Adult Health

## 2018-04-26 ENCOUNTER — Telehealth: Payer: Self-pay | Admitting: *Deleted

## 2018-04-26 NOTE — Telephone Encounter (Signed)
Patient diagnosed with UTI yesterday at [facility] and prescribed Keflex and Zofran. Advised those were fine to take and to make sure she took all of the medication even if her symptoms were gone.  Will resend urine at new ob visit.  Verbalized understanding.

## 2018-05-10 ENCOUNTER — Telehealth: Payer: Self-pay | Admitting: Women's Health

## 2018-05-10 NOTE — Telephone Encounter (Signed)
Patient called, she's requesting a work note stating that she needs to sit by herself and that she needs to take frequent bathroom breaks.  [phone]

## 2018-05-16 ENCOUNTER — Encounter: Payer: Self-pay | Admitting: Advanced Practice Midwife

## 2018-05-16 ENCOUNTER — Encounter: Payer: Self-pay | Admitting: *Deleted

## 2018-05-16 ENCOUNTER — Other Ambulatory Visit: Payer: Self-pay

## 2018-05-16 ENCOUNTER — Ambulatory Visit (INDEPENDENT_AMBULATORY_CARE_PROVIDER_SITE_OTHER): Payer: Medicaid Other | Admitting: Advanced Practice Midwife

## 2018-05-16 ENCOUNTER — Ambulatory Visit: Payer: Medicaid Other | Admitting: *Deleted

## 2018-05-16 VITALS — BP 111/67 | HR 70 | Wt 123.0 lb

## 2018-05-16 DIAGNOSIS — O0930 Supervision of pregnancy with insufficient antenatal care, unspecified trimester: Secondary | ICD-10-CM

## 2018-05-16 DIAGNOSIS — O09892 Supervision of other high risk pregnancies, second trimester: Secondary | ICD-10-CM

## 2018-05-16 DIAGNOSIS — Z1389 Encounter for screening for other disorder: Secondary | ICD-10-CM

## 2018-05-16 DIAGNOSIS — Z3490 Encounter for supervision of normal pregnancy, unspecified, unspecified trimester: Secondary | ICD-10-CM | POA: Insufficient documentation

## 2018-05-16 DIAGNOSIS — Z3A19 19 weeks gestation of pregnancy: Secondary | ICD-10-CM

## 2018-05-16 DIAGNOSIS — Z3482 Encounter for supervision of other normal pregnancy, second trimester: Secondary | ICD-10-CM

## 2018-05-16 DIAGNOSIS — Z1379 Encounter for other screening for genetic and chromosomal anomalies: Secondary | ICD-10-CM

## 2018-05-16 DIAGNOSIS — O0932 Supervision of pregnancy with insufficient antenatal care, second trimester: Secondary | ICD-10-CM

## 2018-05-16 DIAGNOSIS — Z331 Pregnant state, incidental: Secondary | ICD-10-CM

## 2018-05-16 LAB — POCT URINALYSIS DIPSTICK OB
Blood, UA: NEGATIVE
GLUCOSE, UA: NEGATIVE
KETONES UA: NEGATIVE
Leukocytes, UA: NEGATIVE
Nitrite, UA: NEGATIVE
POC,PROTEIN,UA: NEGATIVE

## 2018-05-16 NOTE — Addendum Note (Signed)
Addended by: Moss Mc on: 05/16/2018 01:41 PM   Modules accepted: Orders

## 2018-05-16 NOTE — Progress Notes (Signed)
INITIAL OBSTETRICAL VISIT Patient name: Rita Allen MRN [id number]  Date of birth: May 07, 1995 Chief Complaint:   Initial Prenatal Visit  History of Present Illness:   Rita Allen is a 23 y.o. 762P1001  female at 1343w4d by 8416 weekUS (was 2 weeks different from LMP, which is sure). with an Estimated Date of Delivery: 10/06/18 being seen today for her initial obstetrical visit.   Her obstetrical history is significant for SD w/8#7oz baby, no sequela. Late PNC, short pg interval. PPH, not requiring tx. Today she reports no complaints.  Patient's last menstrual period was 12/16/2017. Last pap 1/19. Results were: normal Review of Systems:   Pertinent items are noted in HPI Denies cramping/contractions, leakage of fluid, vaginal bleeding, abnormal vaginal discharge w/ itching/odor/irritation, headaches, visual changes, shortness of breath, chest pain, abdominal pain, severe nausea/vomiting, or problems with urination or bowel movements unless otherwise stated above.  Pertinent History Reviewed:  Reviewed past medical,surgical, social, obstetrical and family history.  Reviewed problem list, medications and allergies. OB History  Gravida Para Term Preterm AB Living  2 1 1     1   SAB TAB Ectopic Multiple Live Births        0 1    # Outcome Date GA Lbr Len/2nd Weight Sex Delivery Anes PTL Lv  2 Current           1 Term 10/11/17 725w5d 04:24 / 02:09 8 lb 7.5 oz (3.841 kg) F Vag-Spont EPI  LIV     Complications: Shoulder Dystocia   Physical Assessment:   Vitals:   05/16/18 0954  BP: 111/67  Pulse: 70  Weight: 123 lb (55.8 kg)  Body mass index is 21.79 kg/m.       Physical Examination:  General appearance - well appearing, and in no distress  Mental status - alert, oriented to person, place, and time  Psych:  She has a normal mood and affect  Skin - warm and dry, normal color, no suspicious lesions noted  Chest - effort normal, all lung fields clear to auscultation bilaterally  Heart  - normal rate and regular rhythm  Abdomen - soft, nontender  Extremities:  No swelling or varicosities noted   Results for orders placed or performed in visit on 05/16/18 (from the past 24 hour(s))  POC Urinalysis Dipstick OB   Collection Time: 05/16/18 10:06 AM  Result Value Ref Range   Color, UA     Clarity, UA     Glucose, UA Negative Negative   Bilirubin, UA     Ketones, UA neg    Spec Grav, UA     Blood, UA neg    pH, UA     POC,PROTEIN,UA Negative Negative, Trace, Small (1+), Moderate (2+), Large (3+), 4+   Urobilinogen, UA     Nitrite, UA neg    Leukocytes, UA Negative Negative   Appearance     Odor      Assessment & Plan:  1) Low-Risk Pregnancy G2P1001 at 2643w4d with an Estimated Date of Delivery: 10/06/18   2) Initial OB visit  3) Hx SD (get EFW at term) and PPH  4)  US adjusted EDC  2nd trimester US was 14d different from sure LMP; may need to revisit if goes postdates.   Meds: No orders of the defined types were placed in this encounter.   Initial labs obtained Continue prenatal vitamins Reviewed recommended weight gain based on pre-gravid BMI Encouraged well-balanced diet Genetic Screening discussed Quad Screen: requested Cystic fibrosis screening discussed  results reviewed Ultrasound discussed; fetal survey: requested CCNC completed  Follow-up: Return for ASAP for anatomy only; 4 weeks for LROB.   Orders Placed This Encounter  Procedures  . GC/Chlamydia Probe Amp  . Urine Culture  . Obstetric Panel, Including HIV  . Urinalysis, Routine w reflex microscopic  . Pain Management Screening Profile (10S)  . AFP TETRA  . POC Urinalysis Dipstick OB    Jacklyn Shell DNP, [name] 05/16/2018 11:00 AM

## 2018-05-17 LAB — OBSTETRIC PANEL, INCLUDING HIV
ANTIBODY SCREEN: NEGATIVE
Basophils Absolute: 0 10*3/uL (ref 0.0–0.2)
Basos: 1 %
EOS (ABSOLUTE): 0.1 10*3/uL (ref 0.0–0.4)
EOS: 1 %
HEMOGLOBIN: 11.3 g/dL (ref 11.1–15.9)
HIV Screen 4th Generation wRfx: NONREACTIVE
Hematocrit: 33.3 % — ABNORMAL LOW (ref 34.0–46.6)
Hepatitis B Surface Ag: NEGATIVE
IMMATURE GRANS (ABS): 0 10*3/uL (ref 0.0–0.1)
IMMATURE GRANULOCYTES: 0 %
LYMPHS: 19 %
Lymphocytes Absolute: 1.1 10*3/uL (ref 0.7–3.1)
MCH: 32 pg (ref 26.6–33.0)
MCHC: 33.9 g/dL (ref 31.5–35.7)
MCV: 94 fL (ref 79–97)
MONOS ABS: 0.5 10*3/uL (ref 0.1–0.9)
Monocytes: 8 %
NEUTROS PCT: 71 %
Neutrophils Absolute: 4.3 10*3/uL (ref 1.4–7.0)
Platelets: 298 10*3/uL (ref 150–450)
RBC: 3.53 x10E6/uL — ABNORMAL LOW (ref 3.77–5.28)
RDW: 14.7 % (ref 11.7–15.4)
RPR Ser Ql: NONREACTIVE
Rh Factor: POSITIVE
Rubella Antibodies, IGG: <0.9 index — ABNORMAL LOW (ref >0.99)
WBC: 6 10*3/uL (ref 3.4–10.8)

## 2018-05-17 LAB — PMP SCREEN PROFILE (10S), URINE
Amphetamine Scrn, Ur: NEGATIVE ng/mL
BARBITURATE SCREEN URINE: NEGATIVE ng/mL
BENZODIAZEPINE SCREEN, URINE: NEGATIVE ng/mL
CANNABINOIDS UR QL SCN: POSITIVE ng/mL — AB
Cocaine (Metab) Scrn, Ur: NEGATIVE ng/mL
Creatinine(Crt), U: 140.4 mg/dL (ref 20.0–300.0)
Methadone Screen, Urine: NEGATIVE ng/mL
OPIATE SCREEN URINE: NEGATIVE ng/mL
OXYCODONE+OXYMORPHONE UR QL SCN: NEGATIVE ng/mL
PROPOXYPHENE SCREEN URINE: NEGATIVE ng/mL
Ph of Urine: 6.3 (ref 4.5–8.9)
Phencyclidine Qn, Ur: NEGATIVE ng/mL

## 2018-05-17 LAB — URINALYSIS, ROUTINE W REFLEX MICROSCOPIC
Bilirubin, UA: NEGATIVE
GLUCOSE, UA: NEGATIVE
Ketones, UA: NEGATIVE
LEUKOCYTES UA: NEGATIVE
NITRITE UA: NEGATIVE
Protein, UA: NEGATIVE
RBC, UA: NEGATIVE
Specific Gravity, UA: 1.021 (ref 1.005–1.030)
Urobilinogen, Ur: 1 mg/dL (ref 0.2–1.0)
pH, UA: 6 (ref 5.0–7.5)

## 2018-05-17 LAB — MED LIST OPTION NOT SELECTED

## 2018-05-18 LAB — AFP TETRA
DIA Mom Value: 0.82
DIA VALUE (EIA): 169.09 pg/mL
DSR (By Age)    1 IN: 1100
DSR (SECOND TRIMESTER) 1 IN: 5382
GESTATIONAL AGE AFP: 19.4 wk
MSAFP Mom: 0.77
MSAFP: 50.5 ng/mL
MSHCG Mom: 1.36
MSHCG: 38577 m[IU]/mL
Maternal Age At EDD: 23.1 yr
OSB RISK: 10000
T18 (By Age): 1:4285 {titer}
Test Results:: NEGATIVE
UE3 MOM: 0.97
UE3 VALUE: 1.88 ng/mL
Weight: 123 [lb_av]

## 2018-05-18 LAB — URINE CULTURE

## 2018-05-18 LAB — GC/CHLAMYDIA PROBE AMP
CHLAMYDIA, DNA PROBE: NEGATIVE
NEISSERIA GONORRHOEAE BY PCR: NEGATIVE

## 2018-05-23 ENCOUNTER — Other Ambulatory Visit: Payer: Self-pay | Admitting: Advanced Practice Midwife

## 2018-05-23 DIAGNOSIS — Z363 Encounter for antenatal screening for malformations: Secondary | ICD-10-CM

## 2018-05-24 ENCOUNTER — Ambulatory Visit (INDEPENDENT_AMBULATORY_CARE_PROVIDER_SITE_OTHER): Payer: Medicaid Other

## 2018-05-24 DIAGNOSIS — Z363 Encounter for antenatal screening for malformations: Secondary | ICD-10-CM | POA: Diagnosis not present

## 2018-05-24 DIAGNOSIS — Z3402 Encounter for supervision of normal first pregnancy, second trimester: Secondary | ICD-10-CM

## 2018-05-24 DIAGNOSIS — O0930 Supervision of pregnancy with insufficient antenatal care, unspecified trimester: Secondary | ICD-10-CM

## 2018-05-24 DIAGNOSIS — O09892 Supervision of other high risk pregnancies, second trimester: Secondary | ICD-10-CM

## 2018-05-24 NOTE — Progress Notes (Signed)
[place] 20+5 wks,cephalic,anterior placenta gr 0,cx 3.6 cm,svp of fluid 5.6 cm,normal ovaries bilat,efw 382 g 52%,anatomy complete,no obvious abnormalities

## 2018-06-11 ENCOUNTER — Encounter: Payer: Medicaid Other | Admitting: Women's Health

## 2018-06-13 ENCOUNTER — Encounter: Payer: Medicaid Other | Admitting: Obstetrics and Gynecology

## 2018-06-18 ENCOUNTER — Encounter: Payer: Self-pay | Admitting: Women's Health

## 2018-06-18 NOTE — Telephone Encounter (Signed)
Note sent to nurse. 

## 2018-06-20 ENCOUNTER — Encounter: Payer: Medicaid Other | Admitting: Women's Health

## 2018-06-25 ENCOUNTER — Encounter: Payer: Self-pay | Admitting: Advanced Practice Midwife

## 2018-06-25 ENCOUNTER — Other Ambulatory Visit: Payer: Self-pay

## 2018-06-25 ENCOUNTER — Ambulatory Visit (INDEPENDENT_AMBULATORY_CARE_PROVIDER_SITE_OTHER): Payer: Medicaid Other | Admitting: Advanced Practice Midwife

## 2018-06-25 VITALS — BP 122/68 | HR 87 | Wt 141.0 lb

## 2018-06-25 DIAGNOSIS — Z331 Pregnant state, incidental: Secondary | ICD-10-CM

## 2018-06-25 DIAGNOSIS — Z3482 Encounter for supervision of other normal pregnancy, second trimester: Secondary | ICD-10-CM

## 2018-06-25 DIAGNOSIS — Z3A25 25 weeks gestation of pregnancy: Secondary | ICD-10-CM

## 2018-06-25 DIAGNOSIS — Z1389 Encounter for screening for other disorder: Secondary | ICD-10-CM

## 2018-06-25 LAB — POCT URINALYSIS DIPSTICK OB
GLUCOSE, UA: NEGATIVE
Ketones, UA: NEGATIVE
Leukocytes, UA: NEGATIVE
Nitrite, UA: NEGATIVE
POC,PROTEIN,UA: NEGATIVE
RBC UA: NEGATIVE

## 2018-06-25 NOTE — Progress Notes (Signed)
  G2P1001 [redacted]w[redacted]d Estimated Date of Delivery: 10/06/18  Blood pressure 122/68, pulse 87, weight 141 lb (64 kg), last menstrual period 12/16/2017, unknown if currently breastfeeding.   BP weight and urine results all reviewed and noted.  Please refer to the obstetrical flow sheet for the fundal height and fetal heart rate documentation:  Patient reports good fetal movement, denies any bleeding and no rupture of membranes symptoms or regular contractions. Patient is without complaints. All questions were answered.   Physical Assessment:   Vitals:   06/25/18 0952  BP: 122/68  Pulse: 87  Weight: 141 lb (64 kg)  Body mass index is 24.98 kg/m.        Physical Examination:   General appearance: Well appearing, and in no distress  Mental status: Alert, oriented to person, place, and time  Skin: Warm & dry  Cardiovascular: Normal heart rate noted  Respiratory: Normal respiratory effort, no distress  Abdomen: Soft, gravid, nontender  Pelvic: Cervical exam deferred         Extremities: Edema: None  Fetal Status: Fetal Heart Rate (bpm): 148 Fundal Height: 24 cm Movement: Present    Results for orders placed or performed in visit on 06/25/18 (from the past 24 hour(s))  POC Urinalysis Dipstick OB   Collection Time: 06/25/18  9:52 AM  Result Value Ref Range   Color, UA     Clarity, UA     Glucose, UA Negative Negative   Bilirubin, UA     Ketones, UA neg    Spec Grav, UA     Blood, UA neg    pH, UA     POC,PROTEIN,UA Negative Negative, Trace, Small (1+), Moderate (2+), Large (3+), 4+   Urobilinogen, UA     Nitrite, UA neg    Leukocytes, UA Negative Negative   Appearance     Odor       Orders Placed This Encounter  Procedures  . POC Urinalysis Dipstick OB    Plan:  Continued routine obstetrical care,   Return in about 3 weeks (around 07/16/2018) for LROB.

## 2018-06-25 NOTE — Patient Instructions (Addendum)
1. Before your test, do not eat or drink anything for 8-10 hours prior to your  appointment (a small amount of water is allowed and you may take any medicines you normally take). Be sure to drink lots of water the day before. 2. When you arrive, your blood will be drawn for a 'fasting' blood sugar level.  Then you will be given a sweetened carbonated beverage to drink. You should  complete drinking this beverage within five minutes. After finishing the  beverage, you will have your blood drawn exactly 1 and 2 hours later. Having  your blood drawn on time is an important part of this test. A total of three blood  samples will be done. 3. The test takes approximately 2  hours. During the test, do not have anything to  eat or drink. Do not smoke, chew gum (not even sugarless gum) or use breath mints.  4. During the test you should remain close by and seated as much as possible and  avoid walking around. You may want to bring a book or something else to  occupy your time.  5. After your test, you may eat and drink as normal. You may want to bring a snack  to eat after the test is finished. Your provider will advise you as to the results of  this test and any follow-up if necessary  If your sugar test is positive for gestational diabetes, you will be given an phone call and further instructions discussed. If you wish to know all of your test results before your next appointment, feel free to call the office, or look up your test results on Mychart.  (The range that the lab uses for normal values of the sugar test are not necessarily the range that is used for pregnant women; if your results are within the normal range, they are definitely normal.  However, if a value is deemed "high" by the lab, it may not be too high for a pregnant woman.  We will need to discuss the results if your value(s) fall in the "high" category).     Tdap Vaccine  It is recommended that you get the Tdap vaccine during the  third trimester of EACH pregnancy to help protect your baby from getting pertussis (whooping cough)  27-36 weeks is the BEST time to do this so that you can pass the protection on to your baby. During pregnancy is better than after pregnancy, but if you are unable to get it during pregnancy it will be offered at the hospital.  You will be offered this vaccine in the office after 27 weeks.  If you do not have health insurance, you can get the vaccine from the [facility] Department (no appointment needed).   Everyone who will be around your baby should also be up-to-date on their vaccines. Adults (who are not pregnant) only need 1 dose of Tdap during adulthood.      Contraception Choices Contraception, also called birth control, refers to methods or devices that prevent pregnancy. Hormonal methods Contraceptive implant  A contraceptive implant is a thin, plastic tube that contains a hormone. It is inserted into the upper part of the arm. It can remain in place for up to 3 years. Progestin-only injections Progestin-only injections are injections of progestin, a synthetic form of the hormone progesterone. They are given every 3 months by a health care provider. Birth control pills  Birth control pills are pills that contain hormones that prevent pregnancy.  They must be taken once a day, preferably at the same time each day. Birth control patch  The birth control patch contains hormones that prevent pregnancy. It is placed on the skin and must be changed once a week for three weeks and removed on the fourth week. A prescription is needed to use this method of contraception. Vaginal ring  A vaginal ring contains hormones that prevent pregnancy. It is placed in the vagina for three weeks and removed on the fourth week. After that, the process is repeated with a new ring. A prescription is needed to use this method of contraception. Emergency contraceptive Emergency contraceptives  prevent pregnancy after unprotected sex. They come in pill form and can be taken up to 5 days after sex. They work best the sooner they are taken after having sex. Most emergency contraceptives are available without a prescription. This method should not be used as your only form of birth control. Barrier methods Female condom  A female condom is a thin sheath that is worn over the penis during sex. Condoms keep sperm from going inside a woman's body. They can be used with a spermicide to increase their effectiveness. They should be disposed after a single use. Female condom  A female condom is a soft, loose-fitting sheath that is put into the vagina before sex. The condom keeps sperm from going inside a woman's body. They should be disposed after a single use. Diaphragm  A diaphragm is a soft, dome-shaped barrier. It is inserted into the vagina before sex, along with a spermicide. The diaphragm blocks sperm from entering the uterus, and the spermicide kills sperm. A diaphragm should be left in the vagina for 6-8 hours after sex and removed within 24 hours. A diaphragm is prescribed and fitted by a health care provider. A diaphragm should be replaced every 1-2 years, after giving birth, after gaining more than 15 lb (6.8 kg), and after pelvic surgery. Cervical cap  A cervical cap is a round, soft latex or plastic cup that fits over the cervix. It is inserted into the vagina before sex, along with spermicide. It blocks sperm from entering the uterus. The cap should be left in place for 6-8 hours after sex and removed within 48 hours. A cervical cap must be prescribed and fitted by a health care provider. It should be replaced every 2 years. Sponge  A sponge is a soft, circular piece of polyurethane foam with spermicide on it. The sponge helps block sperm from entering the uterus, and the spermicide kills sperm. To use it, you make it wet and then insert it into the vagina. It should be inserted before  sex, left in for at least 6 hours after sex, and removed and thrown away within 30 hours. Spermicides Spermicides are chemicals that kill or block sperm from entering the cervix and uterus. They can come as a cream, jelly, suppository, foam, or tablet. A spermicide should be inserted into the vagina with an applicator at least 10-15 minutes before sex to allow time for it to work. The process must be repeated every time you have sex. Spermicides do not require a prescription. Intrauterine contraception Intrauterine device (IUD) An IUD is a T-shaped device that is put in a woman's uterus. There are two types:  Hormone IUD.This type contains progestin, a synthetic form of the hormone progesterone. This type can stay in place for 3-5 years.  Copper IUD.This type is wrapped in copper wire. It can stay in place for  10 years.  Permanent methods of contraception Female tubal ligation In this method, a woman's fallopian tubes are sealed, tied, or blocked during surgery to prevent eggs from traveling to the uterus. Hysteroscopic sterilization In this method, a small, flexible insert is placed into each fallopian tube. The inserts cause scar tissue to form in the fallopian tubes and block them, so sperm cannot reach an egg. The procedure takes about 3 months to be effective. Another form of birth control must be used during those 3 months. Female sterilization This is a procedure to tie off the tubes that carry sperm (vasectomy). After the procedure, the man can still ejaculate fluid (semen). Natural planning methods Natural family planning In this method, a couple does not have sex on days when the woman could become pregnant. Calendar method This means keeping track of the length of each menstrual cycle, identifying the days when pregnancy can happen, and not having sex on those days. Ovulation method In this method, a couple avoids sex during ovulation. Symptothermal method This method involves not  having sex during ovulation. The woman typically checks for ovulation by watching changes in her temperature and in the consistency of cervical mucus. Post-ovulation method In this method, a couple waits to have sex until after ovulation. Summary  Contraception, also called birth control, means methods or devices that prevent pregnancy.  Hormonal methods of contraception include implants, injections, pills, patches, vaginal rings, and emergency contraceptives.  Barrier methods of contraception can include female condoms, female condoms, diaphragms, cervical caps, sponges, and spermicides.  There are two types of IUDs (intrauterine devices). An IUD can be put in a woman's uterus to prevent pregnancy for 3-5 years.  Permanent sterilization can be done through a procedure for males, females, or both.  Natural family planning methods involve not having sex on days when the woman could become pregnant. This information is not intended to replace advice given to you by your health care provider. Make sure you discuss any questions you have with your health care provider. Document Released: 04/04/2005 Document Revised: 04/06/2017 Document Reviewed: 05/07/2016 Elsevier Interactive Patient Education  2019 [employer].

## 2018-07-13 ENCOUNTER — Telehealth: Payer: Self-pay | Admitting: Women's Health

## 2018-07-13 NOTE — Telephone Encounter (Signed)
Pt aware of Restrictions and screening was done on pt

## 2018-07-16 ENCOUNTER — Ambulatory Visit (INDEPENDENT_AMBULATORY_CARE_PROVIDER_SITE_OTHER): Payer: Medicaid Other | Admitting: Women's Health

## 2018-07-16 ENCOUNTER — Encounter: Payer: Self-pay | Admitting: Women's Health

## 2018-07-16 ENCOUNTER — Other Ambulatory Visit: Payer: Medicaid Other

## 2018-07-16 ENCOUNTER — Other Ambulatory Visit: Payer: Self-pay

## 2018-07-16 VITALS — BP 113/62 | HR 87 | Wt 144.0 lb

## 2018-07-16 DIAGNOSIS — Z3A28 28 weeks gestation of pregnancy: Secondary | ICD-10-CM | POA: Diagnosis not present

## 2018-07-16 DIAGNOSIS — Z331 Pregnant state, incidental: Secondary | ICD-10-CM

## 2018-07-16 DIAGNOSIS — Z131 Encounter for screening for diabetes mellitus: Secondary | ICD-10-CM

## 2018-07-16 DIAGNOSIS — Z23 Encounter for immunization: Secondary | ICD-10-CM | POA: Diagnosis not present

## 2018-07-16 DIAGNOSIS — Z3483 Encounter for supervision of other normal pregnancy, third trimester: Secondary | ICD-10-CM

## 2018-07-16 DIAGNOSIS — Z1389 Encounter for screening for other disorder: Secondary | ICD-10-CM

## 2018-07-16 LAB — POCT URINALYSIS DIPSTICK OB
Glucose, UA: NEGATIVE
Ketones, UA: NEGATIVE
LEUKOCYTES UA: NEGATIVE
Nitrite, UA: NEGATIVE
PROTEIN: NEGATIVE
RBC UA: NEGATIVE

## 2018-07-16 MED ORDER — PANTOPRAZOLE SODIUM 20 MG PO TBEC: 20.0000 mg | DELAYED_RELEASE_TABLET | Freq: Every day | ORAL | 3 refills | Status: DC

## 2018-07-16 NOTE — Progress Notes (Signed)
   LOW-RISK PREGNANCY VISIT Patient name: Rita Allen MRN [id number]  Date of birth: 1995-07-05 Chief Complaint:   Routine Prenatal Visit (PN2)  History of Present Illness:   Rita Allen is a 23 y.o. G36P1001 female at [redacted]w[redacted]d with an Estimated Date of Delivery: 10/06/18 being seen today for ongoing management of a low-risk pregnancy.  Today she reports heartburn. No bp cuff at home. Contractions: Regular.  .  Movement: Present. denies leaking of fluid. Review of Systems:   Pertinent items are noted in HPI Denies abnormal vaginal discharge w/ itching/odor/irritation, headaches, visual changes, shortness of breath, chest pain, abdominal pain, severe nausea/vomiting, or problems with urination or bowel movements unless otherwise stated above. Pertinent History Reviewed:  Reviewed past medical,surgical, social, obstetrical and family history.  Reviewed problem list, medications and allergies. Physical Assessment:   Vitals:   07/16/18 0911  BP: 113/62  Pulse: 87  Weight: 144 lb (65.3 kg)  Body mass index is 25.51 kg/m.        Physical Examination:   General appearance: Well appearing, and in no distress  Mental status: Alert, oriented to person, place, and time  Skin: Warm & dry  Cardiovascular: Normal heart rate noted  Respiratory: Normal respiratory effort, no distress  Abdomen: Soft, gravid, nontender  Pelvic: Cervical exam deferred         Extremities: Edema: None  Fetal Status: Fetal Heart Rate (bpm): 150 Fundal Height: 28 cm Movement: Present    Results for orders placed or performed in visit on 07/16/18 (from the past 24 hour(s))  POC Urinalysis Dipstick OB   Collection Time: 07/16/18  9:11 AM  Result Value Ref Range   Color, UA     Clarity, UA     Glucose, UA Negative Negative   Bilirubin, UA     Ketones, UA neg    Spec Grav, UA     Blood, UA neg    pH, UA     POC,PROTEIN,UA Negative Negative, Trace, Small (1+), Moderate (2+), Large (3+), 4+   Urobilinogen, UA      Nitrite, UA neg    Leukocytes, UA Negative Negative   Appearance     Odor      Assessment & Plan:  1) Low-risk pregnancy G2P1001 at [redacted]w[redacted]d with an Estimated Date of Delivery: 10/06/18   2) Heartburn, rx protonix   Meds:  Meds ordered this encounter  Medications  . pantoprazole (PROTONIX) 20 MG tablet    Sig: Take 1 tablet (20 mg total) by mouth daily.    Dispense:  30 tablet    Refill:  3    Order Specific Question:   Supervising Provider    Answer:   Duane Lope H [[id number]]   Labs/procedures today: pn2, tdap  Plan:  Continue routine obstetrical care   Reviewed: Preterm labor symptoms and general obstetric precautions including but not limited to vaginal bleeding, contractions, leaking of fluid and fetal movement were reviewed in detail with the patient.  All questions were answered  Follow-up: Return in about 4 weeks (around 08/13/2018) for tele visit. d/t coronavirus. Gave bp cuff from office. Check bp weekly, let us know if SBP>=140 or DBP >=90  Orders Placed This Encounter  Procedures  . Tdap vaccine greater than or equal to 7yo IM  . POC Urinalysis Dipstick OB   Cheral Marker [name], [facility] 07/16/2018 9:53 AM

## 2018-07-16 NOTE — Patient Instructions (Addendum)
Rita Allen, I greatly value your feedback.  If you receive a survey following your visit with us today, we appreciate you taking the time to fill it out.  Thanks, [name] , [name], [facility]  [facility] now [facility] ([address] [place], [place] [id number]) Entrance located off of [address] Free 24/7 valet parking   Check your blood pressure once a week. If top > or = 140 or bottom > or = 90, call and let us know.   Call the office [phone]) or go to [facility] if:  You begin to have strong, frequent contractions  Your water breaks.  Sometimes it is a big gush of fluid, sometimes it is just a trickle that keeps getting your panties wet or running down your legs  You have vaginal bleeding.  It is normal to have a small amount of spotting if your cervix was checked.   You don't feel your baby moving like normal.  If you don't, get you something to eat and drink and lay down and focus on feeling your baby move.  You should feel at least 10 movements in 2 hours.  If you don't, you should call the office or go to [facility].    Tdap Vaccine  It is recommended that you get the Tdap vaccine during the third trimester of EACH pregnancy to help protect your baby from getting pertussis (whooping cough)  27-36 weeks is the BEST time to do this so that you can pass the protection on to your baby. During pregnancy is better than after pregnancy, but if you are unable to get it during pregnancy it will be offered at the hospital.   You can get this vaccine with us, at the health department, your family doctor, or some local pharmacies  Everyone who will be around your baby should also be up-to-date on their vaccines before the baby comes. Adults (who are not pregnant) only need 1 dose of Tdap during adulthood.   Third Trimester of Pregnancy The third trimester is from week 29 through week 42, months 7 through 9. The third  trimester is a time when the fetus is growing rapidly. At the end of the ninth month, the fetus is about 20 inches in length and weighs 6-10 pounds.  BODY CHANGES Your body goes through many changes during pregnancy. The changes vary from woman to woman.   Your weight will continue to increase. You can expect to gain 25-35 pounds (11-16 kg) by the end of the pregnancy.  You may begin to get stretch marks on your hips, abdomen, and breasts.  You may urinate more often because the fetus is moving lower into your pelvis and pressing on your bladder.  You may develop or continue to have heartburn as a result of your pregnancy.  You may develop constipation because certain hormones are causing the muscles that push waste through your intestines to slow down.  You may develop hemorrhoids or swollen, bulging veins (varicose veins).  You may have pelvic pain because of the weight gain and pregnancy hormones relaxing your joints between the bones in your pelvis. Backaches may result from overexertion of the muscles supporting your posture.  You may have changes in your hair. These can include thickening of your hair, rapid growth, and changes in texture. Some women also have hair loss during or after pregnancy, or hair that feels dry or thin. Your hair will most  likely return to normal after your baby is born.  Your breasts will continue to grow and be tender. A yellow discharge may leak from your breasts called colostrum.  Your belly button may stick out.  You may feel short of breath because of your expanding uterus.  You may notice the fetus "dropping," or moving lower in your abdomen.  You may have a bloody mucus discharge. This usually occurs a few days to a week before labor begins.  Your cervix becomes thin and soft (effaced) near your due date. WHAT TO EXPECT AT YOUR PRENATAL EXAMS  You will have prenatal exams every 2 weeks until week 36. Then, you will have weekly prenatal exams.  During a routine prenatal visit:  You will be weighed to make sure you and the fetus are growing normally.  Your blood pressure is taken.  Your abdomen will be measured to track your baby's growth.  The fetal heartbeat will be listened to.  Any test results from the previous visit will be discussed.  You may have a cervical check near your due date to see if you have effaced. At around 36 weeks, your caregiver will check your cervix. At the same time, your caregiver will also perform a test on the secretions of the vaginal tissue. This test is to determine if a type of bacteria, Group B streptococcus, is present. Your caregiver will explain this further. Your caregiver may ask you:  What your birth plan is.  How you are feeling.  If you are feeling the baby move.  If you have had any abnormal symptoms, such as leaking fluid, bleeding, severe headaches, or abdominal cramping.  If you have any questions. Other tests or screenings that may be performed during your third trimester include:  Blood tests that check for low iron levels (anemia).  Fetal testing to check the health, activity level, and growth of the fetus. Testing is done if you have certain medical conditions or if there are problems during the pregnancy. FALSE LABOR You may feel small, irregular contractions that eventually go away. These are called Braxton Hicks contractions, or false labor. Contractions may last for hours, days, or even weeks before true labor sets in. If contractions come at regular intervals, intensify, or become painful, it is best to be seen by your caregiver.  SIGNS OF LABOR   Menstrual-like cramps.  Contractions that are 5 minutes apart or less.  Contractions that start on the top of the uterus and spread down to the lower abdomen and back.  A sense of increased pelvic pressure or back pain.  A watery or bloody mucus discharge that comes from the vagina. If you have any of these signs  before the 37th week of pregnancy, call your caregiver right away. You need to go to the hospital to get checked immediately. HOME CARE INSTRUCTIONS   Avoid all smoking, herbs, alcohol, and unprescribed drugs. These chemicals affect the formation and growth of the baby.  Follow your caregiver's instructions regarding medicine use. There are medicines that are either safe or unsafe to take during pregnancy.  Exercise only as directed by your caregiver. Experiencing uterine cramps is a good sign to stop exercising.  Continue to eat regular, healthy meals.  Wear a good support bra for breast tenderness.  Do not use hot tubs, steam rooms, or saunas.  Wear your seat belt at all times when driving.  Avoid raw meat, uncooked cheese, cat litter boxes, and soil used by cats. These carry  germs that can cause birth defects in the baby.  Take your prenatal vitamins.  Try taking a stool softener (if your caregiver approves) if you develop constipation. Eat more high-fiber foods, such as fresh vegetables or fruit and whole grains. Drink plenty of fluids to keep your urine clear or pale yellow.  Take warm sitz baths to soothe any pain or discomfort caused by hemorrhoids. Use hemorrhoid cream if your caregiver approves.  If you develop varicose veins, wear support hose. Elevate your feet for 15 minutes, 3-4 times a day. Limit salt in your diet.  Avoid heavy lifting, wear low heal shoes, and practice good posture.  Rest a lot with your legs elevated if you have leg cramps or low back pain.  Visit your dentist if you have not gone during your pregnancy. Use a soft toothbrush to brush your teeth and be gentle when you floss.  A sexual relationship may be continued unless your caregiver directs you otherwise.  Do not travel far distances unless it is absolutely necessary and only with the approval of your caregiver.  Take prenatal classes to understand, practice, and ask questions about the labor  and delivery.  Make a trial run to the hospital.  Pack your hospital bag.  Prepare the baby's nursery.  Continue to go to all your prenatal visits as directed by your caregiver. SEEK MEDICAL CARE IF:  You are unsure if you are in labor or if your water has broken.  You have dizziness.  You have mild pelvic cramps, pelvic pressure, or nagging pain in your abdominal area.  You have persistent nausea, vomiting, or diarrhea.  You have a bad smelling vaginal discharge.  You have pain with urination. SEEK IMMEDIATE MEDICAL CARE IF:   You have a fever.  You are leaking fluid from your vagina.  You have spotting or bleeding from your vagina.  You have severe abdominal cramping or pain.  You have rapid weight loss or gain.  You have shortness of breath with chest pain.  You notice sudden or extreme swelling of your face, hands, ankles, feet, or legs.  You have not felt your baby move in over an hour.  You have severe headaches that do not go away with medicine.  You have vision changes. Document Released: 03/29/2001 Document Revised: 04/09/2013 Document Reviewed: 06/05/2012 [facility] Patient Information 2015 [place], [place]. This information is not intended to replace advice given to you by your health care provider. Make sure you discuss any questions you have with your health care provider.

## 2018-07-17 ENCOUNTER — Other Ambulatory Visit: Payer: Self-pay | Admitting: Women's Health

## 2018-07-17 LAB — CBC
HEMATOCRIT: 32.3 % — AB (ref 34.0–46.6)
Hemoglobin: 10.5 g/dL — ABNORMAL LOW (ref 11.1–15.9)
MCH: 30.3 pg (ref 26.6–33.0)
MCHC: 32.5 g/dL (ref 31.5–35.7)
MCV: 93 fL (ref 79–97)
Platelets: 231 10*3/uL (ref 150–450)
RBC: 3.47 x10E6/uL — ABNORMAL LOW (ref 3.77–5.28)
RDW: 14.1 % (ref 11.7–15.4)
WBC: 8.4 10*3/uL (ref 3.4–10.8)

## 2018-07-17 LAB — HIV ANTIBODY (ROUTINE TESTING W REFLEX): HIV Screen 4th Generation wRfx: NONREACTIVE

## 2018-07-17 LAB — ANTIBODY SCREEN: Antibody Screen: NEGATIVE

## 2018-07-17 LAB — GLUCOSE TOLERANCE, 2 HOURS W/ 1HR
Glucose, 1 hour: 161 mg/dL (ref 65–179)
Glucose, 2 hour: 125 mg/dL (ref 65–152)
Glucose, Fasting: 85 mg/dL (ref 65–91)

## 2018-07-17 LAB — RPR: RPR: NONREACTIVE

## 2018-07-17 MED ORDER — FERROUS SULFATE 325 (65 FE) MG PO TABS: 325.0000 mg | ORAL_TABLET | Freq: Two times a day (BID) | ORAL | 3 refills | Status: DC

## 2018-08-08 ENCOUNTER — Encounter: Payer: Self-pay | Admitting: *Deleted

## 2018-08-13 ENCOUNTER — Other Ambulatory Visit: Payer: Self-pay

## 2018-08-13 ENCOUNTER — Ambulatory Visit (INDEPENDENT_AMBULATORY_CARE_PROVIDER_SITE_OTHER): Payer: Medicaid Other | Admitting: Obstetrics and Gynecology

## 2018-08-13 ENCOUNTER — Encounter: Payer: Self-pay | Admitting: Obstetrics and Gynecology

## 2018-08-13 VITALS — BP 127/75 | HR 88

## 2018-08-13 DIAGNOSIS — Z3A32 32 weeks gestation of pregnancy: Secondary | ICD-10-CM | POA: Diagnosis not present

## 2018-08-13 DIAGNOSIS — Z3483 Encounter for supervision of other normal pregnancy, third trimester: Secondary | ICD-10-CM

## 2018-08-13 NOTE — Progress Notes (Signed)
   TELEHEALTH VIRTUAL OBSTETRICS VISIT ENCOUNTER NOTE  I connected with Rita Allen on 08/13/18 at 11:00 AM EDT by telephone at home and verified that I am speaking with the correct person using two identifiers.   I discussed the limitations, risks, security and privacy concerns of performing an evaluation and management service by telephone and the availability of in person appointments. I also discussed with the patient that there may be a patient responsible charge related to this service. The patient expressed understanding and agreed to proceed.  Subjective:  Rita Allen is a 23 y.o. G2P1001 at [redacted]w[redacted]d being followed for ongoing prenatal care.  She is currently monitored for the following issues for this low-risk pregnancy and has Supervision of normal pregnancy; Short interval between pregnancies affecting pregnancy in second trimester, antepartum; and Late prenatal care on their problem list.  Patient reports no complaints. Reports fetal movement. Denies any contractions, bleeding or leaking of fluid.   The following portions of the patient's history were reviewed and updated as appropriate: allergies, current medications, past family history, past medical history, past social history, past surgical history and problem list.   Objective:   General:  Alert, oriented and cooperative.   Mental Status: Normal mood and affect perceived. Normal judgment and thought content.  Rest of physical exam deferred due to type of encounter  Assessment and Plan:  Pregnancy: G2P1001 at [redacted]w[redacted]d 1. Encounter for supervision of other normal pregnancy in third trimester Stable Pt has BP cuff Monitoring BP reviewed with pt  Preterm labor symptoms and general obstetric precautions including but not limited to vaginal bleeding, contractions, leaking of fluid and fetal movement were reviewed in detail with the patient.  I discussed the assessment and treatment plan with the patient. The patient was  provided an opportunity to ask questions and all were answered. The patient agreed with the plan and demonstrated an understanding of the instructions. The patient was advised to call back or seek an in-person office evaluation/go to MAU at [facility] for any urgent or concerning symptoms. Please refer to After Visit Summary for other counseling recommendations.   I provided 11 minutes of non-face-to-face time during this encounter.  Return in about 4 weeks (around 09/10/2018) for OB visit, face to face for GBS.  No future appointments.  Hermina Staggers, MD [facility], [facility]

## 2018-08-17 ENCOUNTER — Telehealth: Payer: Self-pay | Admitting: Women's Health

## 2018-08-17 NOTE — Telephone Encounter (Signed)
Patient called, would like to speak to someone about her schedule w/pregnancy.  She stated she sent a mychart message yesterday.  [phone]

## 2018-08-23 ENCOUNTER — Encounter: Payer: Self-pay | Admitting: *Deleted

## 2018-08-27 ENCOUNTER — Other Ambulatory Visit: Payer: Self-pay

## 2018-08-27 ENCOUNTER — Ambulatory Visit (INDEPENDENT_AMBULATORY_CARE_PROVIDER_SITE_OTHER): Payer: Medicaid Other | Admitting: Women's Health

## 2018-08-27 ENCOUNTER — Encounter: Payer: Self-pay | Admitting: Women's Health

## 2018-08-27 VITALS — BP 117/72 | HR 94 | Temp 98.6°F | Wt 149.6 lb

## 2018-08-27 DIAGNOSIS — Z862 Personal history of diseases of the blood and blood-forming organs and certain disorders involving the immune mechanism: Secondary | ICD-10-CM

## 2018-08-27 DIAGNOSIS — O09293 Supervision of pregnancy with other poor reproductive or obstetric history, third trimester: Secondary | ICD-10-CM

## 2018-08-27 DIAGNOSIS — O09299 Supervision of pregnancy with other poor reproductive or obstetric history, unspecified trimester: Secondary | ICD-10-CM

## 2018-08-27 DIAGNOSIS — Z8759 Personal history of other complications of pregnancy, childbirth and the puerperium: Secondary | ICD-10-CM

## 2018-08-27 DIAGNOSIS — O26893 Other specified pregnancy related conditions, third trimester: Secondary | ICD-10-CM

## 2018-08-27 DIAGNOSIS — N898 Other specified noninflammatory disorders of vagina: Secondary | ICD-10-CM

## 2018-08-27 DIAGNOSIS — Z3A34 34 weeks gestation of pregnancy: Secondary | ICD-10-CM

## 2018-08-27 DIAGNOSIS — Z1389 Encounter for screening for other disorder: Secondary | ICD-10-CM

## 2018-08-27 DIAGNOSIS — Z3483 Encounter for supervision of other normal pregnancy, third trimester: Secondary | ICD-10-CM

## 2018-08-27 DIAGNOSIS — Z331 Pregnant state, incidental: Secondary | ICD-10-CM

## 2018-08-27 LAB — POCT URINALYSIS DIPSTICK OB
Blood, UA: NEGATIVE
Glucose, UA: NEGATIVE
Ketones, UA: NEGATIVE
Leukocytes, UA: NEGATIVE
Nitrite, UA: NEGATIVE
POC,PROTEIN,UA: NEGATIVE

## 2018-08-27 LAB — POCT WET PREP (WET MOUNT)
Clue Cells Wet Prep Whiff POC: NEGATIVE
Trichomonas Wet Prep HPF POC: ABSENT

## 2018-08-27 NOTE — Patient Instructions (Signed)
Rita Allen, I greatly value your feedback.  If you receive a survey following your visit with us today, we appreciate you taking the time to fill it out.  Thanks, [name] Kendyn Zaman, [name], [facility]  [facility] now [facility] ([address] [place], [place] [address]) Entrance located off of [address] Free 24/7 valet parking    Call the office [phone]) or go to [facility] if:  You begin to have strong, frequent contractions  Your water breaks.  Sometimes it is a big gush of fluid, sometimes it is just a trickle that keeps getting your panties wet or running down your legs  You have vaginal bleeding.  It is normal to have a small amount of spotting if your cervix was checked.   You don't feel your baby moving like normal.  If you don't, get you something to eat and drink and lay down and focus on feeling your baby move.  You should feel at least 10 movements in 2 hours.  If you don't, you should call the office or go to [facility].    Tdap Vaccine  It is recommended that you get the Tdap vaccine during the third trimester of EACH pregnancy to help protect your baby from getting pertussis (whooping cough)  27-36 weeks is the BEST time to do this so that you can pass the protection on to your baby. During pregnancy is better than after pregnancy, but if you are unable to get it during pregnancy it will be offered at the hospital.   You can get this vaccine with us, at the health department, your family doctor, or some local pharmacies  Everyone who will be around your baby should also be up-to-date on their vaccines before the baby comes. Adults (who are not pregnant) only need 1 dose of Tdap during adulthood.   Third Trimester of Pregnancy The third trimester is from week 29 through week 42, months 7 through 9. The third trimester is a time when the fetus is growing rapidly. At the end of the ninth month, the fetus is  about 20 inches in length and weighs 6-10 pounds.  BODY CHANGES Your body goes through many changes during pregnancy. The changes vary from woman to woman.   Your weight will continue to increase. You can expect to gain 25-35 pounds (11-16 kg) by the end of the pregnancy.  You may begin to get stretch marks on your hips, abdomen, and breasts.  You may urinate more often because the fetus is moving lower into your pelvis and pressing on your bladder.  You may develop or continue to have heartburn as a result of your pregnancy.  You may develop constipation because certain hormones are causing the muscles that push waste through your intestines to slow down.  You may develop hemorrhoids or swollen, bulging veins (varicose veins).  You may have pelvic pain because of the weight gain and pregnancy hormones relaxing your joints between the bones in your pelvis. Backaches may result from overexertion of the muscles supporting your posture.  You may have changes in your hair. These can include thickening of your hair, rapid growth, and changes in texture. Some women also have hair loss during or after pregnancy, or hair that feels dry or thin. Your hair will most likely return to normal after your baby is born.  Your breasts will continue to grow and be tender. A yellow discharge may leak from  your breasts called colostrum.  Your belly button may stick out.  You may feel short of breath because of your expanding uterus.  You may notice the fetus "dropping," or moving lower in your abdomen.  You may have a bloody mucus discharge. This usually occurs a few days to a week before labor begins.  Your cervix becomes thin and soft (effaced) near your due date. WHAT TO EXPECT AT YOUR PRENATAL EXAMS  You will have prenatal exams every 2 weeks until week 36. Then, you will have weekly prenatal exams. During a routine prenatal visit:  You will be weighed to make sure you and the fetus are growing  normally.  Your blood pressure is taken.  Your abdomen will be measured to track your baby's growth.  The fetal heartbeat will be listened to.  Any test results from the previous visit will be discussed.  You may have a cervical check near your due date to see if you have effaced. At around 36 weeks, your caregiver will check your cervix. At the same time, your caregiver will also perform a test on the secretions of the vaginal tissue. This test is to determine if a type of bacteria, Group B streptococcus, is present. Your caregiver will explain this further. Your caregiver may ask you:  What your birth plan is.  How you are feeling.  If you are feeling the baby move.  If you have had any abnormal symptoms, such as leaking fluid, bleeding, severe headaches, or abdominal cramping.  If you have any questions. Other tests or screenings that may be performed during your third trimester include:  Blood tests that check for low iron levels (anemia).  Fetal testing to check the health, activity level, and growth of the fetus. Testing is done if you have certain medical conditions or if there are problems during the pregnancy. FALSE LABOR You may feel small, irregular contractions that eventually go away. These are called Braxton Hicks contractions, or false labor. Contractions may last for hours, days, or even weeks before true labor sets in. If contractions come at regular intervals, intensify, or become painful, it is best to be seen by your caregiver.  SIGNS OF LABOR   Menstrual-like cramps.  Contractions that are 5 minutes apart or less.  Contractions that start on the top of the uterus and spread down to the lower abdomen and back.  A sense of increased pelvic pressure or back pain.  A watery or bloody mucus discharge that comes from the vagina. If you have any of these signs before the 37th week of pregnancy, call your caregiver right away. You need to go to the hospital to  get checked immediately. HOME CARE INSTRUCTIONS   Avoid all smoking, herbs, alcohol, and unprescribed drugs. These chemicals affect the formation and growth of the baby.  Follow your caregiver's instructions regarding medicine use. There are medicines that are either safe or unsafe to take during pregnancy.  Exercise only as directed by your caregiver. Experiencing uterine cramps is a good sign to stop exercising.  Continue to eat regular, healthy meals.  Wear a good support bra for breast tenderness.  Do not use hot tubs, steam rooms, or saunas.  Wear your seat belt at all times when driving.  Avoid raw meat, uncooked cheese, cat litter boxes, and soil used by cats. These carry germs that can cause birth defects in the baby.  Take your prenatal vitamins.  Try taking a stool softener (if your caregiver approves) if  you develop constipation. Eat more high-fiber foods, such as fresh vegetables or fruit and whole grains. Drink plenty of fluids to keep your urine clear or pale yellow.  Take warm sitz baths to soothe any pain or discomfort caused by hemorrhoids. Use hemorrhoid cream if your caregiver approves.  If you develop varicose veins, wear support hose. Elevate your feet for 15 minutes, 3-4 times a day. Limit salt in your diet.  Avoid heavy lifting, wear low heal shoes, and practice good posture.  Rest a lot with your legs elevated if you have leg cramps or low back pain.  Visit your dentist if you have not gone during your pregnancy. Use a soft toothbrush to brush your teeth and be gentle when you floss.  A sexual relationship may be continued unless your caregiver directs you otherwise.  Do not travel far distances unless it is absolutely necessary and only with the approval of your caregiver.  Take prenatal classes to understand, practice, and ask questions about the labor and delivery.  Make a trial run to the hospital.  Pack your hospital bag.  Prepare the baby's  nursery.  Continue to go to all your prenatal visits as directed by your caregiver. SEEK MEDICAL CARE IF:  You are unsure if you are in labor or if your water has broken.  You have dizziness.  You have mild pelvic cramps, pelvic pressure, or nagging pain in your abdominal area.  You have persistent nausea, vomiting, or diarrhea.  You have a bad smelling vaginal discharge.  You have pain with urination. SEEK IMMEDIATE MEDICAL CARE IF:   You have a fever.  You are leaking fluid from your vagina.  You have spotting or bleeding from your vagina.  You have severe abdominal cramping or pain.  You have rapid weight loss or gain.  You have shortness of breath with chest pain.  You notice sudden or extreme swelling of your face, hands, ankles, feet, or legs.  You have not felt your baby move in over an hour.  You have severe headaches that do not go away with medicine.  You have vision changes. Document Released: 03/29/2001 Document Revised: 04/09/2013 Document Reviewed: 06/05/2012 [facility] Patient Information 2015 [place], [place]. This information is not intended to replace advice given to you by your health care provider. Make sure you discuss any questions you have with your health care provider.   Preterm Labor and Birth Information  The normal length of a pregnancy is 39-41 weeks. Preterm labor is when labor starts before 37 completed weeks of pregnancy. What are the risk factors for preterm labor? Preterm labor is more likely to occur in women who:  Have certain infections during pregnancy such as a bladder infection, sexually transmitted infection, or infection inside the uterus (chorioamnionitis).  Have a shorter-than-normal cervix.  Have gone into preterm labor before.  Have had surgery on their cervix.  Are younger than age 60 or older than age 10.  Are African American.  Are pregnant with twins or multiple babies (multiple gestation).  Take street drugs  or smoke while pregnant.  Do not gain enough weight while pregnant.  Became pregnant shortly after having been pregnant. What are the symptoms of preterm labor? Symptoms of preterm labor include:  Cramps similar to those that can happen during a menstrual period. The cramps may happen with diarrhea.  Pain in the abdomen or lower back.  Regular uterine contractions that may feel like tightening of the abdomen.  A feeling of increased  pressure in the pelvis.  Increased watery or bloody mucus discharge from the vagina.  Water breaking (ruptured amniotic sac). Why is it important to recognize signs of preterm labor? It is important to recognize signs of preterm labor because babies who are born prematurely may not be fully developed. This can put them at an increased risk for:  Long-term (chronic) heart and lung problems.  Difficulty immediately after birth with regulating body systems, including blood sugar, body temperature, heart rate, and breathing rate.  Bleeding in the brain.  Cerebral palsy.  Learning difficulties.  Death. These risks are highest for babies who are born before 34 weeks of pregnancy. How is preterm labor treated? Treatment depends on the length of your pregnancy, your condition, and the health of your baby. It may involve:  Having a stitch (suture) placed in your cervix to prevent your cervix from opening too early (cerclage).  Taking or being given medicines, such as: ? Hormone medicines. These may be given early in pregnancy to help support the pregnancy. ? Medicine to stop contractions. ? Medicines to help mature the baby's lungs. These may be prescribed if the risk of delivery is high. ? Medicines to prevent your baby from developing cerebral palsy. If the labor happens before 34 weeks of pregnancy, you may need to stay in the hospital. What should I do if I think I am in preterm labor? If you think that you are going into preterm labor, call your  health care provider right away. How can I prevent preterm labor in future pregnancies? To increase your chance of having a full-term pregnancy:  Do not use any tobacco products, such as cigarettes, chewing tobacco, and e-cigarettes. If you need help quitting, ask your health care provider.  Do not use street drugs or medicines that have not been prescribed to you during your pregnancy.  Talk with your health care provider before taking any herbal supplements, even if you have been taking them regularly.  Make sure you gain a healthy amount of weight during your pregnancy.  Watch for infection. If you think that you might have an infection, get it checked right away.  Make sure to tell your health care provider if you have gone into preterm labor before. This information is not intended to replace advice given to you by your health care provider. Make sure you discuss any questions you have with your health care provider. Document Released: 06/25/2003 Document Revised: 09/15/2015 Document Reviewed: 08/26/2015 Elsevier Interactive Patient Education  2019 [employer].

## 2018-08-27 NOTE — Progress Notes (Signed)
LOW-RISK PREGNANCY VISIT Patient name: Rita Allen MRN [id number]  Date of birth: May 14, 1995 Chief Complaint:   Routine Prenatal Visit (pressure/ discharge)  History of Present Illness:   Rita Allen is a 23 y.o. G68P1001 female at [redacted]w[redacted]d with an Estimated Date of Delivery: 10/06/18 being seen today for ongoing management of a low-risk pregnancy.  Today she reports pressure, discharge- no itching/irritation, sometimes odor. Contractions: Not present.  .  Movement: Present. denies leaking of fluid. Review of Systems:   Pertinent items are noted in HPI Denies abnormal vaginal discharge w/ itching/odor/irritation, headaches, visual changes, shortness of breath, chest pain, abdominal pain, severe nausea/vomiting, or problems with urination or bowel movements unless otherwise stated above. Pertinent History Reviewed:  Reviewed past medical,surgical, social, obstetrical and family history.  Reviewed problem list, medications and allergies. Physical Assessment:   Vitals:   08/27/18 0928  BP: 117/72  Pulse: 94  Temp: 98.6 F (37 C)  Weight: 149 lb 9.6 oz (67.9 kg)  Body mass index is 26.5 kg/m.        Physical Examination:   General appearance: Well appearing, and in no distress  Mental status: Alert, oriented to person, place, and time  Skin: Warm & dry  Cardiovascular: Normal heart rate noted  Respiratory: Normal respiratory effort, no distress  Abdomen: Soft, gravid, nontender  Pelvic: spec exam: mod amt nonodorous yellow d/c, SVE: ft/th/ballotable         Extremities: Edema: Trace  Fetal Status:     Movement: Present    Results for orders placed or performed in visit on 08/27/18 (from the past 24 hour(s))  POC Urinalysis Dipstick OB   Collection Time: 08/27/18  9:32 AM  Result Value Ref Range   Color, UA     Clarity, UA     Glucose, UA Negative Negative   Bilirubin, UA     Ketones, UA neg    Spec Grav, UA     Blood, UA neg    pH, UA     POC,PROTEIN,UA Negative  Negative, Trace, Small (1+), Moderate (2+), Large (3+), 4+   Urobilinogen, UA     Nitrite, UA neg    Leukocytes, UA Negative Negative   Appearance     Odor    POCT Wet Prep [place])   Collection Time: 08/27/18 10:02 AM  Result Value Ref Range   Source Wet Prep POC vaginal    WBC, Wet Prep HPF POC few    Bacteria Wet Prep HPF POC None (A) Few   BACTERIA WET PREP MORPHOLOGY POC     Clue Cells Wet Prep HPF POC None None   Clue Cells Wet Prep Whiff POC Negative Whiff    Yeast Wet Prep HPF POC None None   KOH Wet Prep POC     Trichomonas Wet Prep HPF POC Absent Absent    Assessment & Plan:  1) Low-risk pregnancy G2P1001 at [redacted]w[redacted]d with an Estimated Date of Delivery: 10/06/18   2) Pressure, discussed can be normal, reviewed ptl s/s  3) H/O shoulder dystocia> EFW next visit   Meds: No orders of the defined types were placed in this encounter.  Labs/procedures today: wet prep, sve  Plan:  Continue routine obstetrical care   Reviewed: Preterm labor symptoms and general obstetric precautions including but not limited to vaginal bleeding, contractions, leaking of fluid and fetal movement were reviewed in detail with the patient.  All questions were answered. Check bp weekly, let us know if >140/90  Follow-up: Return in  about 2 weeks (around 09/10/2018) for LROB, US:EFW.  Orders Placed This Encounter  Procedures  . US OB Follow Up  . POC Urinalysis Dipstick OB  . POCT Wet Prep [facility] [place])   Cheral MarkerKimberly R Marykate Heuberger [name], [facility] 08/27/2018 10:04 AM

## 2018-09-07 ENCOUNTER — Other Ambulatory Visit: Payer: Self-pay

## 2018-09-07 ENCOUNTER — Encounter (HOSPITAL_COMMUNITY): Payer: Self-pay | Admitting: *Deleted

## 2018-09-07 ENCOUNTER — Inpatient Hospital Stay (HOSPITAL_COMMUNITY)
Admission: AD | Admit: 2018-09-07 | Discharge: 2018-09-07 | Disposition: A | Discharge status: Home / Self Care | Payer: Medicaid Other | Attending: Family Medicine | Admitting: Family Medicine

## 2018-09-07 ENCOUNTER — Telehealth: Payer: Self-pay | Admitting: Women's Health

## 2018-09-07 DIAGNOSIS — O0933 Supervision of pregnancy with insufficient antenatal care, third trimester: Secondary | ICD-10-CM

## 2018-09-07 DIAGNOSIS — O09892 Supervision of other high risk pregnancies, second trimester: Secondary | ICD-10-CM

## 2018-09-07 DIAGNOSIS — Z833 Family history of diabetes mellitus: Secondary | ICD-10-CM | POA: Insufficient documentation

## 2018-09-07 DIAGNOSIS — Z79899 Other long term (current) drug therapy: Secondary | ICD-10-CM | POA: Insufficient documentation

## 2018-09-07 DIAGNOSIS — R102 Pelvic and perineal pain: Secondary | ICD-10-CM | POA: Insufficient documentation

## 2018-09-07 DIAGNOSIS — Z8249 Family history of ischemic heart disease and other diseases of the circulatory system: Secondary | ICD-10-CM | POA: Diagnosis not present

## 2018-09-07 DIAGNOSIS — Z87891 Personal history of nicotine dependence: Secondary | ICD-10-CM | POA: Insufficient documentation

## 2018-09-07 DIAGNOSIS — Z3A35 35 weeks gestation of pregnancy: Secondary | ICD-10-CM

## 2018-09-07 DIAGNOSIS — N898 Other specified noninflammatory disorders of vagina: Secondary | ICD-10-CM

## 2018-09-07 DIAGNOSIS — O0930 Supervision of pregnancy with insufficient antenatal care, unspecified trimester: Secondary | ICD-10-CM

## 2018-09-07 DIAGNOSIS — Z3483 Encounter for supervision of other normal pregnancy, third trimester: Secondary | ICD-10-CM

## 2018-09-07 DIAGNOSIS — Z3689 Encounter for other specified antenatal screening: Secondary | ICD-10-CM

## 2018-09-07 DIAGNOSIS — O26893 Other specified pregnancy related conditions, third trimester: Secondary | ICD-10-CM | POA: Diagnosis not present

## 2018-09-07 DIAGNOSIS — O09893 Supervision of other high risk pregnancies, third trimester: Secondary | ICD-10-CM | POA: Diagnosis not present

## 2018-09-07 DIAGNOSIS — R109 Unspecified abdominal pain: Secondary | ICD-10-CM | POA: Diagnosis present

## 2018-09-07 LAB — WET PREP, GENITAL
Clue Cells Wet Prep HPF POC: NONE SEEN
Sperm: NONE SEEN
Trich, Wet Prep: NONE SEEN
Yeast Wet Prep HPF POC: NONE SEEN

## 2018-09-07 LAB — URINALYSIS, ROUTINE W REFLEX MICROSCOPIC
Bilirubin Urine: NEGATIVE
Glucose, UA: NEGATIVE mg/dL
Hgb urine dipstick: NEGATIVE
Ketones, ur: NEGATIVE mg/dL
Nitrite: NEGATIVE
Protein, ur: NEGATIVE mg/dL
Specific Gravity, Urine: 1.006 (ref 1.005–1.030)
pH: 7 (ref 5.0–8.0)

## 2018-09-07 NOTE — MAU Provider Note (Signed)
History     CSN: [id number]  Arrival date and time: 09/07/18 1658   First Provider Initiated Contact with Patient 09/07/18 1804     G2P1001 @35 .6 wks presenting with leaking fluid and pelvic pressure. Pelvic pressure has been worse over the last few days. She things the baby has dropped. Had small gush of clear yellow discharge earlier. None since. No recent IC. No VB or ctx. +FM.   OB History    Gravida  2   Para  1   Term  1   Preterm      AB      Living  1     SAB      TAB      Ectopic      Multiple  0   Live Births  1           Past Medical History:  Diagnosis Date  . Chlamydia     Past Surgical History:  Procedure Laterality Date  . right foot surgery      Family History  Problem Relation Age of Onset  . Diabetes Mother   . Hypertension Mother     Social History   Tobacco Use  . Smoking status: Former Smoker    Types: Cigars  . Smokeless tobacco: Never Used  Substance Use Topics  . Alcohol use: No    Frequency: Never  . Drug use: No    Allergies: No Known Allergies  Medications Prior to Admission  Medication Sig Dispense Refill Last Dose  . Prenatal Vit-Fe Fumarate-FA (PRENATAL VITAMIN PO) Take by mouth.   09/07/2018 at 1000  . ferrous sulfate 325 (65 FE) MG tablet Take 1 tablet (325 mg total) by mouth 2 (two) times daily with a meal. 60 tablet 3 Taking  . pantoprazole (PROTONIX) 20 MG tablet Take 1 tablet (20 mg total) by mouth daily. 30 tablet 3 Taking    Review of Systems  Gastrointestinal: Negative for abdominal pain.  Genitourinary: Positive for pelvic pain and vaginal discharge. Negative for vaginal bleeding.   Physical Exam   Blood pressure 106/61, pulse 90, temperature 98.8 F (37.1 C), temperature source Oral, resp. rate 20, weight 68.4 kg, last menstrual period 12/16/2017, SpO2 100 %, unknown if currently breastfeeding.  Physical Exam  Nursing note and vitals reviewed. Constitutional: She is oriented to person,  place, and time. She appears well-developed and well-nourished. No distress.  HENT:  Head: Normocephalic and atraumatic.  Neck: Normal range of motion.  Cardiovascular: Normal rate.  Respiratory: Effort normal. No respiratory distress.  GI: Soft. She exhibits no distension. There is no abdominal tenderness.  gravid  Genitourinary:    Genitourinary Comments: SSE: no pool, fern neg; +thin yellow/green discharge SVE: closed/thick   Musculoskeletal: Normal range of motion.  Neurological: She is alert and oriented to person, place, and time.  Skin: Skin is warm and dry.  Psychiatric: She has a normal mood and affect.  EFM: 130 bpm, mod variability, + accels, no decels Toco: irregular  Results for orders placed or performed during the hospital encounter of 09/07/18 (from the past 24 hour(s))  Urinalysis, Routine w reflex microscopic     Status: Abnormal   Collection Time: 09/07/18  5:33 PM  Result Value Ref Range   Color, Urine YELLOW YELLOW   APPearance HAZY (A) CLEAR   Specific Gravity, Urine 1.006 1.005 - 1.030   pH 7.0 5.0 - 8.0   Glucose, UA NEGATIVE NEGATIVE mg/dL   Hgb urine dipstick NEGATIVE NEGATIVE  Bilirubin Urine NEGATIVE NEGATIVE   Ketones, ur NEGATIVE NEGATIVE mg/dL   Protein, ur NEGATIVE NEGATIVE mg/dL   Nitrite NEGATIVE NEGATIVE   Leukocytes,Ua LARGE (A) NEGATIVE   RBC / HPF 0-5 0 - 5 RBC/hpf   WBC, UA 11-20 0 - 5 WBC/hpf   Bacteria, UA MANY (A) NONE SEEN   Squamous Epithelial / LPF 0-5 0 - 5   Mucus PRESENT   Wet prep, genital     Status: Abnormal   Collection Time: 09/07/18  6:17 PM  Result Value Ref Range   Yeast Wet Prep HPF POC NONE SEEN NONE SEEN   Trich, Wet Prep NONE SEEN NONE SEEN   Clue Cells Wet Prep HPF POC NONE SEEN NONE SEEN   WBC, Wet Prep HPF POC MANY (A) NONE SEEN   Sperm NONE SEEN    MAU Course  Procedures  MDM Labs ordered and reviewed. No evidence of SROM, PTL, or infection. GC pending. UA with many bacteria, will send UC. Stable for  discharge home.  Assessment and Plan  [redacted] weeks gestation Reactive NST Vaginal discharge in pregnancy Discharge home Follow up at [facility] as scheduled PTL precautions  Allergies as of 09/07/2018   No Known Allergies     Medication List    TAKE these medications   ferrous sulfate 325 (65 FE) MG tablet Take 1 tablet (325 mg total) by mouth 2 (two) times daily with a meal.   pantoprazole 20 MG tablet Commonly known as:  PROTONIX Take 1 tablet (20 mg total) by mouth daily.   PRENATAL VITAMIN PO Take by mouth.       Donette LarryMelanie Zeus Marquis, [name] 09/07/2018, 7:17 PM

## 2018-09-07 NOTE — Discharge Instructions (Signed)
Braxton Hicks Contractions Contractions of the uterus can occur throughout pregnancy, but they are not always a sign that you are in labor. You may have practice contractions called Braxton Hicks contractions. These false labor contractions are sometimes confused with true labor. What are Braxton Hicks contractions? Braxton Hicks contractions are tightening movements that occur in the muscles of the uterus before labor. Unlike true labor contractions, these contractions do not result in opening (dilation) and thinning of the cervix. Toward the end of pregnancy (32-34 weeks), Braxton Hicks contractions can happen more often and may become stronger. These contractions are sometimes difficult to tell apart from true labor because they can be very uncomfortable. You should not feel embarrassed if you go to the hospital with false labor. Sometimes, the only way to tell if you are in true labor is for your health care provider to look for changes in the cervix. The health care provider will do a physical exam and may monitor your contractions. If you are not in true labor, the exam should show that your cervix is not dilating and your water has not broken. If there are no other health problems associated with your pregnancy, it is completely safe for you to be sent home with false labor. You may continue to have Braxton Hicks contractions until you go into true labor. How to tell the difference between true labor and false labor True labor  Contractions last 30-70 seconds.  Contractions become very regular.  Discomfort is usually felt in the top of the uterus, and it spreads to the lower abdomen and low back.  Contractions do not go away with walking.  Contractions usually become more intense and increase in frequency.  The cervix dilates and gets thinner. False labor  Contractions are usually shorter and not as strong as true labor contractions.  Contractions are usually irregular.  Contractions  are often felt in the front of the lower abdomen and in the groin.  Contractions may go away when you walk around or change positions while lying down.  Contractions get weaker and are shorter-lasting as time goes on.  The cervix usually does not dilate or become thin. Follow these instructions at home:   Take over-the-counter and prescription medicines only as told by your health care provider.  Keep up with your usual exercises and follow other instructions from your health care provider.  Eat and drink lightly if you think you are going into labor.  If Braxton Hicks contractions are making you uncomfortable: ? Change your position from lying down or resting to walking, or change from walking to resting. ? Sit and rest in a tub of warm water. ? Drink enough fluid to keep your urine pale yellow. Dehydration may cause these contractions. ? Do slow and deep breathing several times an hour.  Keep all follow-up prenatal visits as told by your health care provider. This is important. Contact a health care provider if:  You have a fever.  You have continuous pain in your abdomen. Get help right away if:  Your contractions become stronger, more regular, and closer together.  You have fluid leaking or gushing from your vagina.  You pass blood-tinged mucus (bloody show).  You have bleeding from your vagina.  You have low back pain that you never had before.  You feel your baby's head pushing down and causing pelvic pressure.  Your baby is not moving inside you as much as it used to. Summary  Contractions that occur before labor are   called Braxton Hicks contractions, false labor, or practice contractions.  Braxton Hicks contractions are usually shorter, weaker, farther apart, and less regular than true labor contractions. True labor contractions usually become progressively stronger and regular, and they become more frequent.  Manage discomfort from Braxton Hicks contractions  by changing position, resting in a warm bath, drinking plenty of water, or practicing deep breathing. This information is not intended to replace advice given to you by your health care provider. Make sure you discuss any questions you have with your health care provider. Document Released: 08/18/2016 Document Revised: 01/17/2017 Document Reviewed: 08/18/2016 Elsevier Interactive Patient Education  2019 [employer]  

## 2018-09-07 NOTE — Telephone Encounter (Signed)
LMOM for pt to call/ lmom of restrictions and pt needs to bring a mask

## 2018-09-07 NOTE — MAU Note (Signed)
Been having like abdominal pain for a couple wks.  A lot of pressure.it is constant pressure.  With her last birth, she didn't even feel contractions.  Today after she used the bathroom, she was back in her room, had some watery d/c, like she peed on herself. Hasn't felt any since.

## 2018-09-09 LAB — CULTURE, OB URINE: Culture: 50000 — AB

## 2018-09-11 ENCOUNTER — Other Ambulatory Visit: Payer: Self-pay

## 2018-09-11 ENCOUNTER — Ambulatory Visit (INDEPENDENT_AMBULATORY_CARE_PROVIDER_SITE_OTHER): Payer: Medicaid Other | Admitting: Women's Health

## 2018-09-11 ENCOUNTER — Encounter: Payer: Self-pay | Admitting: Women's Health

## 2018-09-11 ENCOUNTER — Ambulatory Visit (INDEPENDENT_AMBULATORY_CARE_PROVIDER_SITE_OTHER): Payer: Medicaid Other

## 2018-09-11 ENCOUNTER — Encounter: Payer: Medicaid Other | Admitting: Women's Health

## 2018-09-11 VITALS — BP 123/77 | HR 102 | Wt 152.0 lb

## 2018-09-11 DIAGNOSIS — O0930 Supervision of pregnancy with insufficient antenatal care, unspecified trimester: Secondary | ICD-10-CM

## 2018-09-11 DIAGNOSIS — O09299 Supervision of pregnancy with other poor reproductive or obstetric history, unspecified trimester: Secondary | ICD-10-CM

## 2018-09-11 DIAGNOSIS — Z3A36 36 weeks gestation of pregnancy: Secondary | ICD-10-CM

## 2018-09-11 DIAGNOSIS — Z3483 Encounter for supervision of other normal pregnancy, third trimester: Secondary | ICD-10-CM

## 2018-09-11 DIAGNOSIS — O09293 Supervision of pregnancy with other poor reproductive or obstetric history, third trimester: Secondary | ICD-10-CM | POA: Diagnosis not present

## 2018-09-11 DIAGNOSIS — Z8759 Personal history of other complications of pregnancy, childbirth and the puerperium: Secondary | ICD-10-CM

## 2018-09-11 DIAGNOSIS — O09892 Supervision of other high risk pregnancies, second trimester: Secondary | ICD-10-CM

## 2018-09-11 LAB — GC/CHLAMYDIA PROBE AMP (~~LOC~~) NOT AT ARMC
Chlamydia: NEGATIVE
Neisseria Gonorrhea: NEGATIVE

## 2018-09-11 LAB — OB RESULTS CONSOLE GC/CHLAMYDIA: Gonorrhea: NEGATIVE

## 2018-09-11 LAB — OB RESULTS CONSOLE GBS: GBS: NEGATIVE

## 2018-09-11 NOTE — Progress Notes (Signed)
[place] 36+3 wks,cephalic,anterior placenta gr 3,normal ovaries bilat,afi 12.5 cm,fhr 140 bpm,EFW 2791 g 38%

## 2018-09-11 NOTE — Progress Notes (Signed)
   LOW-RISK PREGNANCY VISIT Patient name: Rita Allen MRN [id number]  Date of birth: [birth year] Chief Complaint:   Routine Prenatal Visit ([place] today)  History of Present Illness:   Rita Allen is a 23 y.o. G89P1001 female at [redacted]w[redacted]d with an Estimated Date of Delivery: 10/06/18 being seen today for ongoing management of a low-risk pregnancy.  Today she reports no complaints. Contractions: Not present. Vag. Bleeding: None.  Movement: Present. denies leaking of fluid. Review of Systems:   Pertinent items are noted in HPI Denies abnormal vaginal discharge w/ itching/odor/irritation, headaches, visual changes, shortness of breath, chest pain, abdominal pain, severe nausea/vomiting, or problems with urination or bowel movements unless otherwise stated above. Pertinent History Reviewed:  Reviewed past medical,surgical, social, obstetrical and family history.  Reviewed problem list, medications and allergies. Physical Assessment:   Vitals:   09/11/18 1119  BP: 123/77  Pulse: (!) 102  Weight: 152 lb (68.9 kg)  Body mass index is 26.93 kg/m.        Physical Examination:   General appearance: Well appearing, and in no distress  Mental status: Alert, oriented to person, place, and time  Skin: Warm & dry  Cardiovascular: Normal heart rate noted  Respiratory: Normal respiratory effort, no distress  Abdomen: Soft, gravid, nontender  Pelvic: Cervical exam performed  Dilation: 1 Effacement (%): 50 Station: -2  Extremities: Edema: Trace  Fetal Status: Fetal Heart Rate (bpm): 140 u/s Fundal Height: 36 cm Movement: Present Presentation: Vertex   Today's EFW [place] 36+3 wks,cephalic,anterior placenta gr 3,normal ovaries bilat,afi 12.5 cm,fhr 140 bpm,EFW 2791 g 38%  No results found for this or any previous visit (from the past 24 hour(s)).  Assessment & Plan:  1) Low-risk pregnancy G2P1001 at [redacted]w[redacted]d with an Estimated Date of Delivery: 10/06/18    2) H/O shoulder dystocia, w/ 8lb7.5oz/3841g baby, EFW  today 2791g/38%> extraps to 3475g @ 40wks    Meds: No orders of the defined types were placed in this encounter.  Labs/procedures today: efw u/s, gbs, gc/ct, sve  Plan:  Continue routine obstetrical care   Reviewed: Preterm labor symptoms and general obstetric precautions including but not limited to vaginal bleeding, contractions, leaking of fluid and fetal movement were reviewed in detail with the patient.  All questions were answered  Follow-up: Return in about 1 week (around 09/18/2018) for LROB in office.  Orders Placed This Encounter  Procedures  . Culture, beta strep (group b only)  . GC/Chlamydia Probe Amp   Cheral Marker [name], [facility] 09/11/2018 1:13 PM

## 2018-09-11 NOTE — Patient Instructions (Signed)
Rita Allen, I greatly value your feedback.  If you receive a survey following your visit with [place] today, we appreciate you taking the time to fill it out.  Thanks, Rita Allen, [name], [facility]  [facility] HAS MOVED!!! It is now [facility] at [facility] ([address] [place], [place] [address]) Entrance located off of [address] Free 24/7 valet parking   Home Blood Pressure Monitoring for Patients   Your provider has recommended that you check your blood pressure (BP) at least once a week at home. If you do not have a blood pressure cuff at home, one will be provided for you. Contact your provider if you have not received your monitor within 1 week.   Helpful Tips for Accurate Home Blood Pressure Checks  . Don't smoke, exercise, or drink caffeine 30 minutes before checking your BP . Use the restroom before checking your BP (a full bladder can raise your pressure) . Relax in a comfortable upright chair . Feet on the ground . Left arm resting comfortably on a flat surface at the level of your heart . Legs uncrossed . Back supported . Sit quietly and don't talk . Place the cuff on your bare arm . Adjust snuggly, so that only two fingertips can fit between your skin and the top of the cuff . Check 2 readings separated by at least one minute . Keep a log of your BP readings . For a visual, please reference this diagram: http://ccnc.care/bpdiagram  Provider Name: [facility]     Phone: [phone]  Zone 1: ALL CLEAR  Continue to monitor your symptoms:  . BP reading is less than 140 (top number) or less than 90 (bottom number)  . No right upper stomach pain . No headaches or seeing spots . No feeling nauseated or throwing up . No swelling in face and hands  Zone 2: CAUTION Call your doctor's office for any of the following:  . BP reading is greater than 140 (top number) or greater than 90 (bottom number)  . Stomach pain under your ribs in the middle  or right side . Headaches or seeing spots . Feeling nauseated or throwing up . Swelling in face and hands  Zone 3: EMERGENCY  Seek immediate medical care if you have any of the following:  . BP reading is greater than160 (top number) or greater than 110 (bottom number) . Severe headaches not improving with Tylenol . Serious difficulty catching your breath . Any worsening symptoms from Zone 2     Call the office [phone]) or go to [facility] if:  You begin to have strong, frequent contractions  Your water breaks.  Sometimes it is a big gush of fluid, sometimes it is just a trickle that keeps getting your panties wet or running down your legs  You have vaginal bleeding.  It is normal to have a small amount of spotting if your cervix was checked.   You don't feel your baby moving like normal.  If you don't, get you something to eat and drink and lay down and focus on feeling your baby move.  You should feel at least 10 movements in 2 hours.  If you don't, you should call the office or go to [facility].    Preterm Labor and Birth Information  The normal length of a pregnancy is 39-41 weeks. Preterm labor is when labor starts before 37 completed weeks of pregnancy. What are the risk factors for  preterm labor? Preterm labor is more likely to occur in women who:  Have certain infections during pregnancy such as a bladder infection, sexually transmitted infection, or infection inside the uterus (chorioamnionitis).  Have a shorter-than-normal cervix.  Have gone into preterm labor before.  Have had surgery on their cervix.  Are younger than age 23 or older than age 23.  Are African American.  Are pregnant with twins or multiple babies (multiple gestation).  Take street drugs or smoke while pregnant.  Do not gain enough weight while pregnant.  Became pregnant shortly after having been pregnant. What are the symptoms of preterm labor? Symptoms of preterm labor  include:  Cramps similar to those that can happen during a menstrual period. The cramps may happen with diarrhea.  Pain in the abdomen or lower back.  Regular uterine contractions that may feel like tightening of the abdomen.  A feeling of increased pressure in the pelvis.  Increased watery or bloody mucus discharge from the vagina.  Water breaking (ruptured amniotic sac). Why is it important to recognize signs of preterm labor? It is important to recognize signs of preterm labor because babies who are born prematurely may not be fully developed. This can put them at an increased risk for:  Long-term (chronic) heart and lung problems.  Difficulty immediately after birth with regulating body systems, including blood sugar, body temperature, heart rate, and breathing rate.  Bleeding in the brain.  Cerebral palsy.  Learning difficulties.  Death. These risks are highest for babies who are born before 34 weeks of pregnancy. How is preterm labor treated? Treatment depends on the length of your pregnancy, your condition, and the health of your baby. It may involve:  Having a stitch (suture) placed in your cervix to prevent your cervix from opening too early (cerclage).  Taking or being given medicines, such as: ? Hormone medicines. These may be given early in pregnancy to help support the pregnancy. ? Medicine to stop contractions. ? Medicines to help mature the baby's lungs. These may be prescribed if the risk of delivery is high. ? Medicines to prevent your baby from developing cerebral palsy. If the labor happens before 34 weeks of pregnancy, you may need to stay in the hospital. What should I do if I think I am in preterm labor? If you think that you are going into preterm labor, call your health care provider right away. How can I prevent preterm labor in future pregnancies? To increase your chance of having a full-term pregnancy:  Do not use any tobacco products, such as  cigarettes, chewing tobacco, and e-cigarettes. If you need help quitting, ask your health care provider.  Do not use street drugs or medicines that have not been prescribed to you during your pregnancy.  Talk with your health care provider before taking any herbal supplements, even if you have been taking them regularly.  Make sure you gain a healthy amount of weight during your pregnancy.  Watch for infection. If you think that you might have an infection, get it checked right away.  Make sure to tell your health care provider if you have gone into preterm labor before. This information is not intended to replace advice given to you by your health care provider. Make sure you discuss any questions you have with your health care provider. Document Released: 06/25/2003 Document Revised: 09/15/2015 Document Reviewed: 08/26/2015 Elsevier Interactive Patient Education  2019 [employer]

## 2018-09-15 LAB — CULTURE, BETA STREP (GROUP B ONLY): Strep Gp B Culture: NEGATIVE

## 2018-09-16 LAB — GC/CHLAMYDIA PROBE AMP
Chlamydia trachomatis, NAA: NEGATIVE
Neisseria Gonorrhoeae by PCR: NEGATIVE

## 2018-09-17 ENCOUNTER — Encounter: Payer: Medicaid Other | Admitting: Obstetrics and Gynecology

## 2018-09-19 ENCOUNTER — Encounter: Payer: Self-pay | Admitting: *Deleted

## 2018-09-20 ENCOUNTER — Encounter: Payer: Self-pay | Admitting: Advanced Practice Midwife

## 2018-09-20 ENCOUNTER — Ambulatory Visit (INDEPENDENT_AMBULATORY_CARE_PROVIDER_SITE_OTHER): Payer: Medicaid Other | Admitting: Advanced Practice Midwife

## 2018-09-20 ENCOUNTER — Other Ambulatory Visit: Payer: Self-pay

## 2018-09-20 VITALS — BP 107/73 | HR 95 | Wt 151.0 lb

## 2018-09-20 DIAGNOSIS — Z3A37 37 weeks gestation of pregnancy: Secondary | ICD-10-CM

## 2018-09-20 DIAGNOSIS — Z3483 Encounter for supervision of other normal pregnancy, third trimester: Secondary | ICD-10-CM

## 2018-09-20 DIAGNOSIS — Z331 Pregnant state, incidental: Secondary | ICD-10-CM

## 2018-09-20 DIAGNOSIS — Z1389 Encounter for screening for other disorder: Secondary | ICD-10-CM

## 2018-09-20 LAB — POCT URINALYSIS DIPSTICK OB
Blood, UA: NEGATIVE
Glucose, UA: NEGATIVE
Ketones, UA: NEGATIVE
Nitrite, UA: NEGATIVE

## 2018-09-20 NOTE — Patient Instructions (Signed)
Etonogestrel implant  What is this medicine?  ETONOGESTREL (et oh noe JES trel) is a contraceptive (birth control) device. It is used to prevent pregnancy. It can be used for up to 3 years.  This medicine may be used for other purposes; ask your health care provider or pharmacist if you have questions.  COMMON BRAND NAME(S): Implanon, Nexplanon  What should I tell my health care provider before I take this medicine?  They need to know if you have any of these conditions:  -abnormal vaginal bleeding  -blood vessel disease or blood clots  -breast, cervical, endometrial, ovarian, liver, or uterine cancer  -diabetes  -gallbladder disease  -heart disease or recent heart attack  -high blood pressure  -high cholesterol or triglycerides  -kidney disease  -liver disease  -migraine headaches  -seizures  -stroke  -tobacco smoker  -an unusual or allergic reaction to etonogestrel, anesthetics or antiseptics, other medicines, foods, dyes, or preservatives  -pregnant or trying to get pregnant  -breast-feeding  How should I use this medicine?  This device is inserted just under the skin on the inner side of your upper arm by a health care professional.  Talk to your pediatrician regarding the use of this medicine in children. Special care may be needed.  Overdosage: If you think you have taken too much of this medicine contact a poison control center or emergency room at once.  NOTE: This medicine is only for you. Do not share this medicine with others.  What if I miss a dose?  This does not apply.  What may interact with this medicine?  Do not take this medicine with any of the following medications:  -amprenavir  -fosamprenavir  This medicine may also interact with the following medications:  -acitretin  -aprepitant  -armodafinil  -bexarotene  -bosentan  -carbamazepine  -certain medicines for fungal infections like fluconazole, ketoconazole, itraconazole and voriconazole  -certain medicines to treat hepatitis, HIV or  AIDS  -cyclosporine  -felbamate  -griseofulvin  -lamotrigine  -modafinil  -oxcarbazepine  -phenobarbital  -phenytoin  -primidone  -rifabutin  -rifampin  -rifapentine  -St. John's wort  -topiramate  This list may not describe all possible interactions. Give your health care provider a list of all the medicines, herbs, non-prescription drugs, or dietary supplements you use. Also tell them if you smoke, drink alcohol, or use illegal drugs. Some items may interact with your medicine.  What should I watch for while using this medicine?  This product does not protect you against HIV infection (AIDS) or other sexually transmitted diseases.  You should be able to feel the implant by pressing your fingertips over the skin where it was inserted. Contact your doctor if you cannot feel the implant, and use a non-hormonal birth control method (such as condoms) until your doctor confirms that the implant is in place. Contact your doctor if you think that the implant may have broken or become bent while in your arm.  You will receive a user card from your health care provider after the implant is inserted. The card is a record of the location of the implant in your upper arm and when it should be removed. Keep this card with your health records.  What side effects may I notice from receiving this medicine?  Side effects that you should report to your doctor or health care professional as soon as possible:  -allergic reactions like skin rash, itching or hives, swelling of the face, lips, or tongue  -breast lumps, breast tissue   changes, or discharge  -breathing problems  -changes in emotions or moods  -if you feel that the implant may have broken or bent while in your arm  -high blood pressure  -pain, irritation, swelling, or bruising at the insertion site  -scar at site of insertion  -signs of infection at the insertion site such as fever, and skin redness, pain or discharge  -signs and symptoms of a blood clot such as breathing  problems; changes in vision; chest pain; severe, sudden headache; pain, swelling, warmth in the leg; trouble speaking; sudden numbness or weakness of the face, arm or leg  -signs and symptoms of liver injury like dark yellow or brown urine; general ill feeling or flu-like symptoms; light-colored stools; loss of appetite; nausea; right upper belly pain; unusually weak or tired; yellowing of the eyes or skin  -unusual vaginal bleeding, discharge  Side effects that usually do not require medical attention (report to your doctor or health care professional if they continue or are bothersome):  -acne  -breast pain or tenderness  -headache  -irregular menstrual bleeding  -nausea  This list may not describe all possible side effects. Call your doctor for medical advice about side effects. You may report side effects to FDA at 1-800-FDA-1088.  Where should I keep my medicine?  This drug is given in a hospital or clinic and will not be stored at home.  NOTE: This sheet is a summary. It may not cover all possible information. If you have questions about this medicine, talk to your doctor, pharmacist, or health care provider.   2019 Elsevier/Gold Standard (2017-02-21 14:11:42)  Levonorgestrel intrauterine device (IUD)  What is this medicine?  LEVONORGESTREL IUD (LEE voe nor jes trel) is a contraceptive (birth control) device. The device is placed inside the uterus by a healthcare professional. It is used to prevent pregnancy. This device can also be used to treat heavy bleeding that occurs during your period.  This medicine may be used for other purposes; ask your health care provider or pharmacist if you have questions.  COMMON BRAND NAME(S): Kyleena, LILETTA, Mirena, Skyla  What should I tell my health care provider before I take this medicine?  They need to know if you have any of these conditions:  -abnormal Pap smear  -cancer of the breast, uterus, or cervix  -diabetes  -endometritis  -genital or pelvic infection now or  in the past  -have more than one sexual partner or your partner has more than one partner  -heart disease  -history of an ectopic or tubal pregnancy  -immune system problems  -IUD in place  -liver disease or tumor  -problems with blood clots or take blood-thinners  -seizures  -use intravenous drugs  -uterus of unusual shape  -vaginal bleeding that has not been explained  -an unusual or allergic reaction to levonorgestrel, other hormones, silicone, or polyethylene, medicines, foods, dyes, or preservatives  -pregnant or trying to get pregnant  -breast-feeding  How should I use this medicine?  This device is placed inside the uterus by a health care professional.  Talk to your pediatrician regarding the use of this medicine in children. Special care may be needed.  Overdosage: If you think you have taken too much of this medicine contact a poison control center or emergency room at once.  NOTE: This medicine is only for you. Do not share this medicine with others.  What if I miss a dose?  This does not apply. Depending on the brand of device   you have inserted, the device will need to be replaced every 3 to 5 years if you wish to continue using this type of birth control.  What may interact with this medicine?  Do not take this medicine with any of the following medications:  -amprenavir  -bosentan  -fosamprenavir  This medicine may also interact with the following medications:  -aprepitant  -armodafinil  -barbiturate medicines for inducing sleep or treating seizures  -bexarotene  -boceprevir  -griseofulvin  -medicines to treat seizures like carbamazepine, ethotoin, felbamate, oxcarbazepine, phenytoin, topiramate  -modafinil  -pioglitazone  -rifabutin  -rifampin  -rifapentine  -some medicines to treat HIV infection like atazanavir, efavirenz, indinavir, lopinavir, nelfinavir, tipranavir, ritonavir  -St. John's wort  -warfarin  This list may not describe all possible interactions. Give your health care provider a list of  all the medicines, herbs, non-prescription drugs, or dietary supplements you use. Also tell them if you smoke, drink alcohol, or use illegal drugs. Some items may interact with your medicine.  What should I watch for while using this medicine?  Visit your doctor or health care professional for regular check ups. See your doctor if you or your partner has sexual contact with others, becomes HIV positive, or gets a sexual transmitted disease.  This product does not protect you against HIV infection (AIDS) or other sexually transmitted diseases.  You can check the placement of the IUD yourself by reaching up to the top of your vagina with clean fingers to feel the threads. Do not pull on the threads. It is a good habit to check placement after each menstrual period. Call your doctor right away if you feel more of the IUD than just the threads or if you cannot feel the threads at all.  The IUD may come out by itself. You may become pregnant if the device comes out. If you notice that the IUD has come out use a backup birth control method like condoms and call your health care provider.  Using tampons will not change the position of the IUD and are okay to use during your period.  This IUD can be safely scanned with magnetic resonance imaging (MRI) only under specific conditions. Before you have an MRI, tell your healthcare provider that you have an IUD in place, and which type of IUD you have in place.  What side effects may I notice from receiving this medicine?  Side effects that you should report to your doctor or health care professional as soon as possible:  -allergic reactions like skin rash, itching or hives, swelling of the face, lips, or tongue  -fever, flu-like symptoms  -genital sores  -high blood pressure  -no menstrual period for 6 weeks during use  -pain, swelling, warmth in the leg  -pelvic pain or tenderness  -severe or sudden headache  -signs of pregnancy  -stomach cramping  -sudden shortness of  breath  -trouble with balance, talking, or walking  -unusual vaginal bleeding, discharge  -yellowing of the eyes or skin  Side effects that usually do not require medical attention (report to your doctor or health care professional if they continue or are bothersome):  -acne  -breast pain  -change in sex drive or performance  -changes in weight  -cramping, dizziness, or faintness while the device is being inserted  -headache  -irregular menstrual bleeding within first 3 to 6 months of use  -nausea  This list may not describe all possible side effects. Call your doctor for medical advice about side effects.   You may report side effects to FDA at 1-800-FDA-1088.  Where should I keep my medicine?  This does not apply.  NOTE: This sheet is a summary. It may not cover all possible information. If you have questions about this medicine, talk to your doctor, pharmacist, or health care provider.   2019 Elsevier/Gold Standard (2016-01-15 14:14:56)

## 2018-09-20 NOTE — Progress Notes (Signed)
  G2P1001 [redacted]w[redacted]d Estimated Date of Delivery: 10/06/18  Blood pressure 107/73, pulse 95, weight 151 lb (68.5 kg), last menstrual period 12/16/2017, unknown if currently breastfeeding.   BP weight and urine results all reviewed and noted.  Please refer to the obstetrical flow sheet for the fundal height and fetal heart rate documentation:  Patient reports good fetal movement, denies any bleeding and no rupture of membranes symptoms or regular contractions. Patient is without complaints. All questions were answered.   Physical Assessment:   Vitals:   09/20/18 0842  BP: 107/73  Pulse: 95  Weight: 151 lb (68.5 kg)  Body mass index is 26.75 kg/m.        Physical Examination:   General appearance: Well appearing, and in no distress  Mental status: Alert, oriented to person, place, and time  Skin: Warm & dry  Cardiovascular: Normal heart rate noted  Respiratory: Normal respiratory effort, no distress  Abdomen: Soft, gravid, nontender  Pelvic: Cervical exam performed  Dilation: 1.5 Effacement (%): 50 Station: -2  Extremities: Edema: None  Fetal Status: Fetal Heart Rate (bpm): 136 Fundal Height: 37 cm Movement: Present Presentation: Vertex  Results for orders placed or performed in visit on 09/20/18 (from the past 24 hour(s))  POC Urinalysis Dipstick OB   Collection Time: 09/20/18  8:49 AM  Result Value Ref Range   Color, UA     Clarity, UA     Glucose, UA Negative Negative   Bilirubin, UA     Ketones, UA neg    Spec Grav, UA     Blood, UA neg    pH, UA     POC,PROTEIN,UA Trace Negative, Trace, Small (1+), Moderate (2+), Large (3+), 4+   Urobilinogen, UA     Nitrite, UA neg    Leukocytes, UA Moderate (2+) (A) Negative   Appearance     Odor       Orders Placed This Encounter  Procedures  . POC Urinalysis Dipstick OB    Plan:  Continued routine obstetrical care, Discussed BC/given handouts  Return in about 1 week (around 09/27/2018) for LROB.

## 2018-09-23 ENCOUNTER — Encounter: Payer: Self-pay | Admitting: Advanced Practice Midwife

## 2018-09-26 ENCOUNTER — Encounter: Payer: Self-pay | Admitting: *Deleted

## 2018-09-27 ENCOUNTER — Encounter: Payer: Self-pay | Admitting: Women's Health

## 2018-09-27 ENCOUNTER — Other Ambulatory Visit: Payer: Self-pay

## 2018-09-27 ENCOUNTER — Ambulatory Visit (INDEPENDENT_AMBULATORY_CARE_PROVIDER_SITE_OTHER): Payer: Medicaid Other | Admitting: Women's Health

## 2018-09-27 VITALS — BP 116/71 | HR 99 | Wt 152.6 lb

## 2018-09-27 DIAGNOSIS — N898 Other specified noninflammatory disorders of vagina: Secondary | ICD-10-CM

## 2018-09-27 DIAGNOSIS — Z3A38 38 weeks gestation of pregnancy: Secondary | ICD-10-CM | POA: Diagnosis not present

## 2018-09-27 DIAGNOSIS — O26893 Other specified pregnancy related conditions, third trimester: Secondary | ICD-10-CM | POA: Diagnosis not present

## 2018-09-27 DIAGNOSIS — Z1389 Encounter for screening for other disorder: Secondary | ICD-10-CM

## 2018-09-27 DIAGNOSIS — Z3483 Encounter for supervision of other normal pregnancy, third trimester: Secondary | ICD-10-CM

## 2018-09-27 DIAGNOSIS — Z331 Pregnant state, incidental: Secondary | ICD-10-CM

## 2018-09-27 LAB — POCT URINALYSIS DIPSTICK OB
Blood, UA: NEGATIVE
Glucose, UA: NEGATIVE
Ketones, UA: NEGATIVE
Leukocytes, UA: NEGATIVE
Nitrite, UA: NEGATIVE
POC,PROTEIN,UA: NEGATIVE

## 2018-09-27 LAB — POCT WET PREP (WET MOUNT)
Clue Cells Wet Prep Whiff POC: NEGATIVE
Trichomonas Wet Prep HPF POC: ABSENT

## 2018-09-27 NOTE — Progress Notes (Signed)
LOW-RISK PREGNANCY VISIT Patient name: Rita ShanksDearika Allen MRN [id number]  Date of birth: 1995/08/14 Chief Complaint:   Routine Prenatal Visit  History of Present Illness:   Rita Allen is a 23 y.o. 352P1001 female at 6421w5d with an Estimated Date of Delivery: 10/06/18 being seen today for ongoing management of a low-risk pregnancy.  Today she reports no complaints. Wants membranes swept asap. Contractions: Not present.  .  Movement: Present. denies leaking of fluid. Review of Systems:   Pertinent items are noted in HPI Denies abnormal vaginal discharge w/ itching/odor/irritation, headaches, visual changes, shortness of breath, chest pain, abdominal pain, severe nausea/vomiting, or problems with urination or bowel movements unless otherwise stated above. Pertinent History Reviewed:  Reviewed past medical,surgical, social, obstetrical and family history.  Reviewed problem list, medications and allergies. Physical Assessment:   Vitals:   09/27/18 1114  BP: 116/71  Pulse: 99  Weight: 152 lb 9.6 oz (69.2 kg)  Body mass index is 27.03 kg/m.        Physical Examination:   General appearance: Well appearing, and in no distress  Mental status: Alert, oriented to person, place, and time  Skin: Warm & dry  Cardiovascular: Normal heart rate noted  Respiratory: Normal respiratory effort, no distress  Abdomen: Soft, gravid, nontender  Pelvic: Cervical exam performed  Dilation: 2 Effacement (%): 50 Station: -2  Lots of d/c, all of vulva/legs  Extremities: Edema: None  Fetal Status: Fetal Heart Rate (bpm): 140 Fundal Height: 37 cm Movement: Present Presentation: Vertex  Results for orders placed or performed in visit on 09/27/18 (from the past 24 hour(s))  POC Urinalysis Dipstick OB   Collection Time: 09/27/18 11:13 AM  Result Value Ref Range   Color, UA     Clarity, UA     Glucose, UA Negative Negative   Bilirubin, UA     Ketones, UA neg    Spec Grav, UA     Blood, UA neg    pH, UA      POC,PROTEIN,UA Negative Negative, Trace, Small (1+), Moderate (2+), Large (3+), 4+   Urobilinogen, UA     Nitrite, UA neg    Leukocytes, UA Negative Negative   Appearance     Odor    POCT Wet Prep Mellody Drown([facility] [place])   Collection Time: 09/27/18 11:43 AM  Result Value Ref Range   Source Wet Prep POC vaginal    WBC, Wet Prep HPF POC few    Bacteria Wet Prep HPF POC Few Few   BACTERIA WET PREP MORPHOLOGY POC     Clue Cells Wet Prep HPF POC None None   Clue Cells Wet Prep Whiff POC Negative Whiff    Yeast Wet Prep HPF POC None None   KOH Wet Prep POC     Trichomonas Wet Prep HPF POC Absent Absent    Assessment & Plan:  1) Low-risk pregnancy G2P1001 at 7321w5d with an Estimated Date of Delivery: 10/06/18   2) Normal vaginal d/c   Meds: No orders of the defined types were placed in this encounter.  Labs/procedures today: sve, wet prep  Plan:  Continue routine obstetrical care   Reviewed: Term labor symptoms and general obstetric precautions including but not limited to vaginal bleeding, contractions, leaking of fluid and fetal movement were reviewed in detail with the patient.  All questions were answered  Follow-up: Return for Tuesday LROB in person w/ me.  Orders Placed This Encounter  Procedures  . POC Urinalysis Dipstick OB  . POCT Wet Prep (  [employer])   [address] [name], [facility] 09/27/2018 11:45 AM

## 2018-09-27 NOTE — Patient Instructions (Signed)
Rita Allen, I greatly value your feedback.  If you receive a survey following your visit with us today, we appreciate you taking the time to fill it out.  Thanks, Kim Carlisle Torgeson, [name], WHNP-BC  [facility] is now [facility] ([address] [place], [place] [address]) Entrance located off of [address] Free 24/7 valet parking    Call the office ([phone]) or go to [facility] if:  You begin to have strong, frequent contractions  Your water breaks.  Sometimes it is a big gush of fluid, sometimes it is just a trickle that keeps getting your panties wet or running down your legs  You have vaginal bleeding.  It is normal to have a small amount of spotting if your cervix was checked.   You don't feel your baby moving like normal.  If you don't, get you something to eat and drink and lay down and focus on feeling your baby move.  You should feel at least 10 movements in 2 hours.  If you don't, you should call the office or go to [facility].      Braxton Hicks Contractions Contractions of the uterus can occur throughout pregnancy, but they are not always a sign that you are in labor. You may have practice contractions called Braxton Hicks contractions. These false labor contractions are sometimes confused with true labor. What are Braxton Hicks contractions? Braxton Hicks contractions are tightening movements that occur in the muscles of the uterus before labor. Unlike true labor contractions, these contractions do not result in opening (dilation) and thinning of the cervix. Toward the end of pregnancy (32-34 weeks), Braxton Hicks contractions can happen more often and may become stronger. These contractions are sometimes difficult to tell apart from true labor because they can be very uncomfortable. You should not feel embarrassed if you go to the hospital with false labor. Sometimes, the only way to tell if you are in true labor is  for your health care provider to look for changes in the cervix. The health care provider will do a physical exam and may monitor your contractions. If you are not in true labor, the exam should show that your cervix is not dilating and your water has not broken. If there are no other health problems associated with your pregnancy, it is completely safe for you to be sent home with false labor. You may continue to have Braxton Hicks contractions until you go into true labor. How to tell the difference between true labor and false labor True labor  Contractions last 30-70 seconds.  Contractions become very regular.  Discomfort is usually felt in the top of the uterus, and it spreads to the lower abdomen and low back.  Contractions do not go away with walking.  Contractions usually become more intense and increase in frequency.  The cervix dilates and gets thinner. False labor  Contractions are usually shorter and not as strong as true labor contractions.  Contractions are usually irregular.  Contractions are often felt in the front of the lower abdomen and in the groin.  Contractions may go away when you walk around or change positions while lying down.  Contractions get weaker and are shorter-lasting as time goes on.  The cervix usually does not dilate or become thin. Follow these instructions at home:   Take over-the-counter and prescription medicines only as told by your health care provider.  Keep up with your usual exercises and   follow other instructions from your health care provider.  Eat and drink lightly if you think you are going into labor.  If Braxton Hicks contractions are making you uncomfortable: ? Change your position from lying down or resting to walking, or change from walking to resting. ? Sit and rest in a tub of warm water. ? Drink enough fluid to keep your urine pale yellow. Dehydration may cause these contractions. ? Do slow and deep breathing several  times an hour.  Keep all follow-up prenatal visits as told by your health care provider. This is important. Contact a health care provider if:  You have a fever.  You have continuous pain in your abdomen. Get help right away if:  Your contractions become stronger, more regular, and closer together.  You have fluid leaking or gushing from your vagina.  You pass blood-tinged mucus (bloody show).  You have bleeding from your vagina.  You have low back pain that you never had before.  You feel your baby's head pushing down and causing pelvic pressure.  Your baby is not moving inside you as much as it used to. Summary  Contractions that occur before labor are called Braxton Hicks contractions, false labor, or practice contractions.  Braxton Hicks contractions are usually shorter, weaker, farther apart, and less regular than true labor contractions. True labor contractions usually become progressively stronger and regular, and they become more frequent.  Manage discomfort from Braxton Hicks contractions by changing position, resting in a warm bath, drinking plenty of water, or practicing deep breathing. This information is not intended to replace advice given to you by your health care provider. Make sure you discuss any questions you have with your health care provider. Document Released: 08/18/2016 Document Revised: 01/17/2017 Document Reviewed: 08/18/2016 Elsevier Interactive Patient Education  2019 [employer].  

## 2018-10-01 ENCOUNTER — Encounter: Payer: Self-pay | Admitting: *Deleted

## 2018-10-02 ENCOUNTER — Ambulatory Visit (INDEPENDENT_AMBULATORY_CARE_PROVIDER_SITE_OTHER): Payer: Medicaid Other | Admitting: Women's Health

## 2018-10-02 ENCOUNTER — Other Ambulatory Visit: Payer: Self-pay

## 2018-10-02 ENCOUNTER — Encounter: Payer: Self-pay | Admitting: Women's Health

## 2018-10-02 VITALS — BP 114/72 | HR 94 | Wt 153.8 lb

## 2018-10-02 DIAGNOSIS — Z3A39 39 weeks gestation of pregnancy: Secondary | ICD-10-CM

## 2018-10-02 DIAGNOSIS — O480 Post-term pregnancy: Secondary | ICD-10-CM

## 2018-10-02 DIAGNOSIS — Z3483 Encounter for supervision of other normal pregnancy, third trimester: Secondary | ICD-10-CM

## 2018-10-02 DIAGNOSIS — Z1389 Encounter for screening for other disorder: Secondary | ICD-10-CM

## 2018-10-02 DIAGNOSIS — Z331 Pregnant state, incidental: Secondary | ICD-10-CM

## 2018-10-02 LAB — POCT URINALYSIS DIPSTICK OB
Blood, UA: NEGATIVE
Glucose, UA: NEGATIVE
Ketones, UA: NEGATIVE
Leukocytes, UA: NEGATIVE
Nitrite, UA: NEGATIVE
POC,PROTEIN,UA: NEGATIVE

## 2018-10-02 NOTE — Progress Notes (Signed)
   LOW-RISK PREGNANCY VISIT Patient name: Rita Allen MRN [id number]  Date of birth: 08/05/1995 Chief Complaint:   Routine Prenatal Visit  History of Present Illness:   Corrinne Benegas is a 23 y.o. G86P1001 female at [redacted]w[redacted]d with an Estimated Date of Delivery: 10/06/18 being seen today for ongoing management of a low-risk pregnancy.  Today she reports no complaints. Wants membranes swept. Contractions: Not present.  .  Movement: Absent. denies leaking of fluid. Review of Systems:   Pertinent items are noted in HPI Denies abnormal vaginal discharge w/ itching/odor/irritation, headaches, visual changes, shortness of breath, chest pain, abdominal pain, severe nausea/vomiting, or problems with urination or bowel movements unless otherwise stated above. Pertinent History Reviewed:  Reviewed past medical,surgical, social, obstetrical and family history.  Reviewed problem list, medications and allergies. Physical Assessment:   Vitals:   10/02/18 1224  BP: 114/72  Pulse: 94  Weight: 153 lb 12.8 oz (69.8 kg)  Body mass index is 27.24 kg/m.        Physical Examination:   General appearance: Well appearing, and in no distress  Mental status: Alert, oriented to person, place, and time  Skin: Warm & dry  Cardiovascular: Normal heart rate noted  Respiratory: Normal respiratory effort, no distress  Abdomen: Soft, gravid, nontender  Pelvic: Cervical exam performed  Dilation: 4 Effacement (%): 50 Station: -2 Offered membrane sweeping, discussed r/b- pt decided to proceed, so membranes swept.   Extremities: Edema: None  Fetal Status: Fetal Heart Rate (bpm): 140 Fundal Height: 37 cm Movement: Absent Presentation: Vertex  Results for orders placed or performed in visit on 10/02/18 (from the past 24 hour(s))  POC Urinalysis Dipstick OB   Collection Time: 10/02/18 12:29 PM  Result Value Ref Range   Color, UA     Clarity, UA     Glucose, UA Negative Negative   Bilirubin, UA     Ketones, UA neg     Spec Grav, UA     Blood, UA neg    pH, UA     POC,PROTEIN,UA Negative Negative, Trace, Small (1+), Moderate (2+), Large (3+), 4+   Urobilinogen, UA     Nitrite, UA neg    Leukocytes, UA Negative Negative   Appearance     Odor      Assessment & Plan:  1) Low-risk pregnancy G2P1001 at [redacted]w[redacted]d with an Estimated Date of Delivery: 10/06/18   2) H/O shoulder dystocia, EFW @ 36wks 38%  3) H/O PPH   Meds: No orders of the defined types were placed in this encounter.  Labs/procedures today: sve, membrane sweep  Plan:  Continue routine obstetrical care   Reviewed: Term labor symptoms and general obstetric precautions including but not limited to vaginal bleeding, contractions, leaking of fluid and fetal movement were reviewed in detail with the patient.  All questions were answered  Follow-up: Return in about 1 week (around 10/09/2018) for LROB in person, US:BPP.  Orders Placed This Encounter  Procedures  . US FETAL BPP WO NON STRESS  . POC Urinalysis Dipstick OB   Roma Schanz [name], [facility] 10/02/2018 12:53 PM

## 2018-10-02 NOTE — Patient Instructions (Signed)
Rita Allen, I greatly value your feedback.  If you receive a survey following your visit with us today, we appreciate you taking the time to fill it out.  Thanks, Rita Allen, [name], [facility]  [facility] is now [facility] ([address] [place], [place] [address]) Entrance located off of [address] Free 24/7 valet parking    Call the office [phone]) or go to [facility] if:  You begin to have strong, frequent Allen  Your water breaks.  Sometimes it is a big gush of fluid, sometimes it is just a trickle that keeps getting your panties wet or running down your legs  You have vaginal bleeding.  It is normal to have a small amount of spotting if your cervix was checked.   You don't feel your baby moving like normal.  If you don't, get you something to eat and drink and lay down and focus on feeling your baby move.  You should feel at least 10 movements in 2 hours.  If you don't, you should call the office or go to [facility].      [facility] Allen of the uterus can occur throughout pregnancy, but they are not always a sign that you are in labor. You may have practice Allen called Rita Allen. These false labor Allen are sometimes confused with true labor. What are Rita PeltonBraxton Hicks Allen? Rita Allen are tightening movements that occur in the muscles of the uterus before labor. Unlike true labor Allen, these contractions do not result in opening (dilation) and thinning of the cervix. Toward the end of pregnancy (32-34 weeks), Rita Allen can happen more often and may become stronger. These Allen are sometimes difficult to tell apart from true labor because they can be very uncomfortable. You should not feel embarrassed if you go to the hospital with false labor. Sometimes, the only way to tell if you are in true labor is  for your health care provider to look for changes in the cervix. The health care provider will do a physical exam and may monitor your Allen. If you are not in true labor, the exam should show that your cervix is not dilating and your water has not broken. If there are no other health problems associated with your pregnancy, it is completely safe for you to be sent home with false labor. You may continue to have Rita Allen until you go into true labor. How to tell the difference between true labor and false labor True labor  Allen last 30-70 seconds.  Allen become very regular.  Discomfort is usually felt in the top of the uterus, and it spreads to the lower abdomen and low back.  Contractions do not go away with walking.  Allen usually become more intense and increase in frequency.  The cervix dilates and gets thinner. False labor  Allen are usually shorter and not as strong as true labor Allen.  Allen are usually irregular.  Allen are often felt in the front of the lower abdomen and in the groin.  Allen may go away when you walk around or change positions while lying down.  Allen get weaker and are shorter-lasting as time goes on.  The cervix usually does not dilate or become thin. Follow these instructions at home:   Take over-the-counter and prescription medicines only as told by your health care provider.  Keep up with your usual exercises and  follow other instructions from your health care provider.  Eat and drink lightly if you think you are going into labor.  If Rita Allen are making you uncomfortable: ? Change your position from lying down or resting to walking, or change from walking to resting. ? Sit and rest in a tub of warm water. ? Drink enough fluid to keep your urine pale yellow. Dehydration may cause these Allen. ? Do slow and deep breathing several  times an hour.  Keep all follow-up prenatal visits as told by your health care provider. This is important. Contact a health care provider if:  You have a fever.  You have continuous pain in your abdomen. Get help right away if:  Your Allen become stronger, more regular, and closer together.  You have fluid leaking or gushing from your vagina.  You pass blood-tinged mucus (bloody show).  You have bleeding from your vagina.  You have low back pain that you never had before.  You feel your baby's head pushing down and causing pelvic pressure.  Your baby is not moving inside you as much as it used to. Summary  Allen that occur before labor are called Rita Allen, false labor, or practice Allen.  Rita Allen are usually shorter, weaker, farther apart, and less regular than true labor Allen. True labor Allen usually become progressively stronger and regular, and they become more frequent.  Manage discomfort from [facility] Allen by changing position, resting in a warm bath, drinking plenty of water, or practicing deep breathing. This information is not intended to replace advice given to you by your health care provider. Make sure you discuss any questions you have with your health care provider. Document Released: 08/18/2016 Document Revised: 01/17/2017 Document Reviewed: 08/18/2016 Elsevier Interactive Patient Education  2019 [employer].

## 2018-10-03 ENCOUNTER — Encounter: Payer: Self-pay | Admitting: *Deleted

## 2018-10-03 ENCOUNTER — Encounter (HOSPITAL_COMMUNITY): Payer: Self-pay

## 2018-10-03 ENCOUNTER — Inpatient Hospital Stay (EMERGENCY_DEPARTMENT_HOSPITAL)
Admission: AD | Admit: 2018-10-03 | Discharge: 2018-10-03 | Disposition: A | Discharge status: Home / Self Care | Payer: Medicaid Other | Source: Home / Self Care | Attending: Obstetrics and Gynecology | Admitting: Obstetrics and Gynecology

## 2018-10-03 ENCOUNTER — Other Ambulatory Visit: Payer: Self-pay | Admitting: *Deleted

## 2018-10-03 ENCOUNTER — Other Ambulatory Visit: Payer: Self-pay

## 2018-10-03 ENCOUNTER — Telehealth: Payer: Self-pay | Admitting: Women's Health

## 2018-10-03 DIAGNOSIS — O480 Post-term pregnancy: Secondary | ICD-10-CM

## 2018-10-03 DIAGNOSIS — Z3483 Encounter for supervision of other normal pregnancy, third trimester: Secondary | ICD-10-CM

## 2018-10-03 DIAGNOSIS — Z3A39 39 weeks gestation of pregnancy: Secondary | ICD-10-CM

## 2018-10-03 DIAGNOSIS — O471 False labor at or after 37 completed weeks of gestation: Secondary | ICD-10-CM | POA: Insufficient documentation

## 2018-10-03 DIAGNOSIS — O479 False labor, unspecified: Secondary | ICD-10-CM

## 2018-10-03 LAB — URINALYSIS, ROUTINE W REFLEX MICROSCOPIC
Bilirubin Urine: NEGATIVE
Glucose, UA: NEGATIVE mg/dL
Ketones, ur: NEGATIVE mg/dL
Nitrite: NEGATIVE
Protein, ur: NEGATIVE mg/dL
Specific Gravity, Urine: 1.014 (ref 1.005–1.030)
pH: 6 (ref 5.0–8.0)

## 2018-10-03 NOTE — MAU Note (Signed)
I have communicated with Marcille Buffy [name]  and reviewed vital signs:    Vaginal exam:  Dilation: 4 Effacement (%): 50 Station: -2 Presentation: Vertex(Heather [name] verified Vtx by u/s.) Exam by:: Marcille Buffy [name],   Also reviewed contraction pattern and that non-stress test is reactive.  It has been documented that patient is contracting every 10 minutes to occasionally with no cervical change over 1 hours not indicating active labor.  Patient denies any other complaints.  Based on this report provider has given order for discharge.  A discharge order and diagnosis entered by a provider.   Labor discharge instructions reviewed with patient.

## 2018-10-03 NOTE — MAU Note (Signed)
Was 4 cm yesterday at office. Saw bloody show at home before coming.   No leaking. Baby moving well. Contractions - felt like tightning and cramping - timed it 8-11 min apart last night.  Did not time contractions today.

## 2018-10-03 NOTE — Telephone Encounter (Signed)
Patient called, stated she is 39 weeks.  Stated she is having a bloody show.  She would like to speak to a nurse.  [phone]

## 2018-10-03 NOTE — Telephone Encounter (Signed)
Patient notified of BPP at [facility] on Monday 6/22 @ 3:45.  Patient's request as she could not come any other day.

## 2018-10-03 NOTE — MAU Note (Signed)
Pt asked if there would be a chaperone present for cervical exam.  Rita Allen NT accompanied me in room and cervical exam completed.  Pt tolerated well.

## 2018-10-03 NOTE — Discharge Instructions (Signed)

## 2018-10-03 NOTE — MAU Provider Note (Signed)
First Provider Initiated Contact with Patient 10/03/18 1340       S: Ms. Rita Allen is a 23 y.o. G2P1001 at [redacted]w[redacted]d  who presents to MAU today complaining contractions q 20 minutes since yesterday. She denies vaginal bleeding. She denies LOF. She reports normal fetal movement.    O: LMP 12/16/2017  GENERAL: Well-developed, well-nourished female in no acute distress.  HEAD: Normocephalic, atraumatic.  CHEST: Normal effort of breathing, regular heart rate ABDOMEN: Soft, nontender, gravid  Cervical exam:  Dilation: 2.5(external os 2.5cm funneling) Effacement (%): Thick Station: -3 Presentation: Undeterminable Exam by:: Benji STanleyRN   Fetal Monitoring: Baseline: 145 Variability: moderate Accelerations: 15x15 Decelerations: none Contractions: rare  Pt informed that the ultrasound is considered a limited OB ultrasound and is not intended to be a complete ultrasound exam.  Patient also informed that the ultrasound is not being completed with the intent of assessing for fetal or placental anomalies or any pelvic abnormalities.  Explained that the purpose of today's ultrasound is to assess for  presentation.  Patient acknowledges the purpose of the exam and the limitations of the study.    VERTEX   A: 1. False labor   2. [redacted] weeks gestation of pregnancy     P: DC home    3rd Trimester precautions  labor precautions  Fetal kick counts RX: none  Return to MAU as needed FU with OB as planned  Follow-up Information    [facility] Follow up.   Specialty: Obstetrics and Gynecology Contact information: Camden Ansley Wheaton DNP, [name]  10/03/18  1:51 PM

## 2018-10-04 ENCOUNTER — Inpatient Hospital Stay (HOSPITAL_COMMUNITY): Payer: Medicaid Other | Admitting: Anesthesiology

## 2018-10-04 ENCOUNTER — Encounter (HOSPITAL_COMMUNITY): Payer: Self-pay

## 2018-10-04 ENCOUNTER — Encounter: Payer: Medicaid Other | Admitting: Obstetrics & Gynecology

## 2018-10-04 ENCOUNTER — Inpatient Hospital Stay (HOSPITAL_COMMUNITY)
Admission: AD | Admit: 2018-10-04 | Discharge: 2018-10-07 | DRG: 805 | Disposition: A | Discharge status: Home / Self Care | Payer: Medicaid Other | Attending: Obstetrics & Gynecology | Admitting: Obstetrics & Gynecology

## 2018-10-04 ENCOUNTER — Telehealth: Payer: Self-pay | Admitting: *Deleted

## 2018-10-04 ENCOUNTER — Other Ambulatory Visit: Payer: Self-pay

## 2018-10-04 DIAGNOSIS — O9902 Anemia complicating childbirth: Secondary | ICD-10-CM | POA: Diagnosis present

## 2018-10-04 DIAGNOSIS — O411230 Chorioamnionitis, third trimester, not applicable or unspecified: Secondary | ICD-10-CM | POA: Diagnosis present

## 2018-10-04 DIAGNOSIS — O4202 Full-term premature rupture of membranes, onset of labor within 24 hours of rupture: Secondary | ICD-10-CM | POA: Diagnosis present

## 2018-10-04 DIAGNOSIS — O09892 Supervision of other high risk pregnancies, second trimester: Secondary | ICD-10-CM

## 2018-10-04 DIAGNOSIS — Z1159 Encounter for screening for other viral diseases: Secondary | ICD-10-CM | POA: Diagnosis not present

## 2018-10-04 DIAGNOSIS — Z3490 Encounter for supervision of normal pregnancy, unspecified, unspecified trimester: Secondary | ICD-10-CM

## 2018-10-04 DIAGNOSIS — Z87891 Personal history of nicotine dependence: Secondary | ICD-10-CM | POA: Diagnosis not present

## 2018-10-04 DIAGNOSIS — D649 Anemia, unspecified: Secondary | ICD-10-CM | POA: Diagnosis present

## 2018-10-04 DIAGNOSIS — Z3A39 39 weeks gestation of pregnancy: Secondary | ICD-10-CM | POA: Diagnosis not present

## 2018-10-04 DIAGNOSIS — Z3689 Encounter for other specified antenatal screening: Secondary | ICD-10-CM

## 2018-10-04 DIAGNOSIS — Z3483 Encounter for supervision of other normal pregnancy, third trimester: Secondary | ICD-10-CM

## 2018-10-04 DIAGNOSIS — Z8759 Personal history of other complications of pregnancy, childbirth and the puerperium: Secondary | ICD-10-CM

## 2018-10-04 DIAGNOSIS — O0930 Supervision of pregnancy with insufficient antenatal care, unspecified trimester: Secondary | ICD-10-CM

## 2018-10-04 LAB — CBC
HCT: 31 % — ABNORMAL LOW (ref 36.0–46.0)
Hemoglobin: 9.5 g/dL — ABNORMAL LOW (ref 12.0–15.0)
MCH: 27.4 pg (ref 26.0–34.0)
MCHC: 30.6 g/dL (ref 30.0–36.0)
MCV: 89.3 fL (ref 80.0–100.0)
Platelets: 194 10*3/uL (ref 150–400)
RBC: 3.47 MIL/uL — ABNORMAL LOW (ref 3.87–5.11)
RDW: 15.2 % (ref 11.5–15.5)
WBC: 9.8 10*3/uL (ref 4.0–10.5)
nRBC: 0.3 % — ABNORMAL HIGH (ref 0.0–0.2)

## 2018-10-04 LAB — POCT FERN TEST: POCT Fern Test: POSITIVE

## 2018-10-04 LAB — SARS CORONAVIRUS 2: SARS Coronavirus 2: NOT DETECTED

## 2018-10-04 MED ORDER — PHENYLEPHRINE 40 MCG/ML (10ML) SYRINGE FOR IV PUSH (FOR BLOOD PRESSURE SUPPORT): 80.0000 ug | PREFILLED_SYRINGE | INTRAVENOUS | Status: DC | PRN

## 2018-10-04 MED ORDER — LIDOCAINE HCL (PF) 1 % IJ SOLN: 30.0000 mL | INTRAMUSCULAR | Status: DC | PRN

## 2018-10-04 MED ORDER — OXYTOCIN BOLUS FROM INFUSION
500.0000 mL | Freq: Once | INTRAVENOUS | Status: AC
  Administered 2018-10-05: 500 mL via INTRAVENOUS

## 2018-10-04 MED ORDER — LIDOCAINE HCL (PF) 1 % IJ SOLN
INTRAMUSCULAR | Status: DC | PRN
  Administered 2018-10-04 (×2): 4 mL via EPIDURAL

## 2018-10-04 MED ORDER — EPHEDRINE 5 MG/ML INJ
10.0000 mg | INTRAVENOUS | Status: DC | PRN
  Filled 2018-10-04: qty 10

## 2018-10-04 MED ORDER — LACTATED RINGERS IV SOLN
500.0000 mL | INTRAVENOUS | Status: DC | PRN
  Administered 2018-10-05 (×2): 500 mL via INTRAVENOUS

## 2018-10-04 MED ORDER — FENTANYL-BUPIVACAINE-NACL 0.5-0.125-0.9 MG/250ML-% EP SOLN
12.0000 mL/h | EPIDURAL | Status: DC | PRN
  Filled 2018-10-04: qty 250

## 2018-10-04 MED ORDER — SODIUM CHLORIDE (PF) 0.9 % IJ SOLN
INTRAMUSCULAR | Status: DC | PRN
  Administered 2018-10-04: 12 mL/h via EPIDURAL

## 2018-10-04 MED ORDER — LACTATED RINGERS IV SOLN
500.0000 mL | Freq: Once | INTRAVENOUS | Status: AC
  Administered 2018-10-04: 500 mL via INTRAVENOUS

## 2018-10-04 MED ORDER — LACTATED RINGERS IV SOLN
INTRAVENOUS | Status: DC
  Administered 2018-10-04 – 2018-10-05 (×3): via INTRAVENOUS

## 2018-10-04 MED ORDER — DIPHENHYDRAMINE HCL 50 MG/ML IJ SOLN: 12.5000 mg | INTRAMUSCULAR | Status: DC | PRN

## 2018-10-04 MED ORDER — OXYTOCIN 40 UNITS IN NORMAL SALINE INFUSION - SIMPLE MED
2.5000 [IU]/h | INTRAVENOUS | Status: DC
  Filled 2018-10-04: qty 1000

## 2018-10-04 MED ORDER — OXYTOCIN 40 UNITS IN NORMAL SALINE INFUSION - SIMPLE MED
1.0000 m[IU]/min | INTRAVENOUS | Status: DC
  Administered 2018-10-04: 18:00:00 2 m[IU]/min via INTRAVENOUS

## 2018-10-04 MED ORDER — EPHEDRINE 5 MG/ML INJ: 10.0000 mg | INTRAVENOUS | Status: DC | PRN

## 2018-10-04 MED ORDER — ONDANSETRON HCL 4 MG/2ML IJ SOLN: 4.0000 mg | Freq: Four times a day (QID) | INTRAMUSCULAR | Status: DC | PRN

## 2018-10-04 MED ORDER — LACTATED RINGERS IV SOLN: 500.0000 mL | Freq: Once | INTRAVENOUS | Status: DC

## 2018-10-04 MED ORDER — FENTANYL CITRATE (PF) 100 MCG/2ML IJ SOLN: 50.0000 ug | INTRAMUSCULAR | Status: DC | PRN

## 2018-10-04 MED ORDER — ACETAMINOPHEN 325 MG PO TABS
650.0000 mg | ORAL_TABLET | ORAL | Status: DC | PRN
  Administered 2018-10-05: 650 mg via ORAL
  Filled 2018-10-04: qty 2

## 2018-10-04 MED ORDER — TERBUTALINE SULFATE 1 MG/ML IJ SOLN: 0.2500 mg | Freq: Once | INTRAMUSCULAR | Status: DC | PRN

## 2018-10-04 MED ORDER — SOD CITRATE-CITRIC ACID 500-334 MG/5ML PO SOLN: 30.0000 mL | ORAL | Status: DC | PRN

## 2018-10-04 NOTE — H&P (Addendum)
Obstetric Admission History & Physical  Subjective Rita Allen is a 23 y.o. female G2P1001 with IUP at [redacted]w[redacted]d by ultrasound at 16 wks admitted for rupture of membranes with clear fluid. Reports fetal movement. Denies vaginal bleeding. She received her prenatal care at [facility].  Support person in labor: FOB  Ultrasounds . 16+3 limited US, dating  .  20+3 - normal anatomy scan . 36+3 - EFW 2791g, 38%ile  Prenatal History/Complications: . History of shoulder dystocia . Short interval between pregnancies . History of postpartum hemorrhage  Past Medical History:  Diagnosis Date  . Chlamydia    Past Surgical History:  Procedure Laterality Date  . right foot surgery     OB History    Gravida  2   Para  1   Term  1   Preterm      AB      Living  1     SAB      TAB      Ectopic      Multiple  0   Live Births  1          Social History   Socioeconomic History  . Marital status: Single    Spouse name: Torrence  . Number of children: 1  . Years of education: Not on file  . Highest education level: Not on file  Occupational History  . Not on file  Social Needs  . Financial resource strain: Not hard at all  . Food insecurity    Worry: Never true    Inability: Never true  . Transportation needs    Medical: No    Non-medical: No  Tobacco Use  . Smoking status: Former Smoker    Types: Cigars  . Smokeless tobacco: Never Used  Substance and Sexual Activity  . Alcohol use: No    Frequency: Never  . Drug use: No  . Sexual activity: Yes    Birth control/protection: None  Lifestyle  . Physical activity    Days per week: 5 days    Minutes per session: 90 min  . Stress: Not at all  Relationships  . Social Herbalist on phone: More than three times a week    Gets together: Never    Attends religious service: Never    Active member of club or organization: No    Attends meetings of clubs or organizations: Never    Relationship status:  Living with partner  Other Topics Concern  . Not on file  Social History Narrative  . Not on file   Family History  Problem Relation Age of Onset  . Diabetes Mother   . Hypertension Mother    Allergies: No Known Allergies Medications:  No outpatient medications have been marked as taking for the 10/04/18 encounter [facility] Encounter).    Review of Systems  All systems reviewed and negative except as stated in HPI  Objective Physical Exam:  Temp 98.2 F (36.8 C)   Resp 16   Ht 5\' 3"  (1.6 m)   Wt 69.9 kg   LMP 12/16/2017   SpO2 97%   BMI 27.28 kg/m  General appearance: alert, cooperative and no distress Lungs: no respiratory distress Heart: RRR. No BLEE.  Abdomen: soft, non-tender; gravid  Pelvic: deferred Extremities: Moving spontaneously, warm, well perfused. 2+ DP.  Uterine activity: irritable Cervical Exam:  Dilation: 4 Effacement (%): 50 Station: -2, -3 Exam by:: benji stanley RN Presentation: cephalic Fetal monitoring: baseline 145 / mod variability/ +  a / -d  Prenatal labs: ABO, Rh: A/Positive/-- (01/29 1050) Antibody: Negative (03/30 0852) Rubella: <0.90 (01/29 1050) Nonimmune RPR: Non Reactive (03/30 0852)  HBsAg: Negative (01/29 1050)  HIV: Non Reactive (03/30 0852)  GBS:   negative Glucola: 85/161/125 Genetic screening:  AFP negative  Prenatal Transfer Tool  Maternal Diabetes: No Genetic Screening: Normal Maternal Ultrasounds/Referrals: Normal Fetal Ultrasounds or other Referrals:  None Maternal Substance Abuse:  No Significant Maternal Medications:  None Significant Maternal Lab Results: GBS negative  Results for orders placed or performed during the hospital encounter of 10/04/18 (from the past 24 hour(s))  POCT fern test   Collection Time: 10/04/18  1:57 PM  Result Value Ref Range   POCT Fern Test Positive = ruptured amniotic membanes    Patient Active Problem List   Diagnosis Date Noted  . Intrauterine pregnancy 10/04/2018  .  History of shoulder dystocia in prior pregnancy 08/27/2018  . History of postpartum hemorrhage 08/27/2018  . Supervision of normal pregnancy 05/16/2018  . Short interval between pregnancies affecting pregnancy in second trimester, antepartum 05/16/2018  . Late prenatal care 05/16/2018   Assessment & Plan:  Rita Allen is a 23 y.o. G2P1001 at 7242w5d - SROM at 1145a with clear fluid.  Labor: Expectant management. Regular Diet.  . Pain control: IV fentanyl PRN and possible epidural . Anticipated MOD: NSVD . PPH risk: moderate (Hx of PPH)  Fetal Wellbeing: Cephalic by CE.  Category I tracing. . GBS:   negative . Continuous fetal monitoring  Postpartum Planning . Circ: in/out patient / Breast or bottle / Contraception: undecided   . Rubella NON immune, [facility]ap [facility]   Genia Hotterachel Kim, M.D.  Family Medicine  PGY-1 10/04/2018 2:48 PM

## 2018-10-04 NOTE — Anesthesia Procedure Notes (Signed)
Epidural Patient location during procedure: OB Start time: 10/04/2018 8:31 PM End time: 10/04/2018 8:34 PM  Staffing Anesthesiologist: Brennan Bailey, MD Performed: anesthesiologist   Preanesthetic Checklist Completed: patient identified, pre-op evaluation, timeout performed, IV checked, risks and benefits discussed and monitors and equipment checked  Epidural Patient position: sitting Prep: site prepped and draped and DuraPrep Patient monitoring: continuous pulse ox, blood pressure, heart rate and cardiac monitor Approach: midline Location: L3-L4 Injection technique: LOR air  Needle:  Needle type: Tuohy  Needle gauge: 17 G Needle length: 9 cm Needle insertion depth: 5 cm Catheter type: closed end flexible Catheter size: 19 Gauge Catheter at skin depth: 10 cm Test dose: negative and Other (1% lidocaine)  Assessment Events: blood not aspirated, injection not painful, no injection resistance, negative IV test and no paresthesia  Additional Notes Patient identified. Risks, benefits, and alternatives discussed with patient including but not limited to bleeding, infection, nerve damage, paralysis, failed block, incomplete pain control, headache, blood pressure changes, nausea, vomiting, reactions to medication, itching, and postpartum back pain. Confirmed with bedside nurse the patient's most recent platelet count. Confirmed with patient that they are not currently taking any anticoagulation, have any bleeding history, or any family history of bleeding disorders. Patient expressed understanding and wished to proceed. All questions were answered. Sterile technique was used throughout the entire procedure. Crisp LOR on first pass. Please see nursing notes for vital signs. Test dose was given through epidural catheter and negative prior to continuing to dose epidural or start infusion. Warning signs of high block given to the patient including shortness of breath, tingling/numbness in  hands, complete motor block, or any concerning symptoms with instructions to call for help. Patient was given instructions on fall risk and not to get out of bed. All questions and concerns addressed with instructions to call with any issues or inadequate analgesia.  Reason for block:procedure for pain

## 2018-10-04 NOTE — Telephone Encounter (Signed)
Pt called with possible leaking fluid. Went to [employer] yesterday with pressure and some spotting. + baby movement. I spoke with Dr. Elonda Husky and he advised will work in at 2:00 today. Angie P notified and placed on schedule. [place]

## 2018-10-04 NOTE — Progress Notes (Signed)
Vitals:   10/04/18 1601 10/04/18 1653  BP: 120/65 118/82  Pulse: 95 97  Resp: 18 18  Temp:  98.2 F (36.8 C)  SpO2:     Not feeling ctx.  Ctx q 7-10 minutes, lasting 90 seconds. FHR Cat 1. No change in cx. Will augment w/pitocin

## 2018-10-04 NOTE — Progress Notes (Signed)
Vitals:   10/04/18 1931 10/04/18 2009  BP: (!) 107/57 125/72  Pulse: (!) 103 (!) 103  Resp: 18 18  Temp:    SpO2:     Starting to feel ctx, but not in any pain. Wants epidural before it "gets bad".  IUPC placed (CTX difficult to interpret w/EFM). Pitocin at 17mu/min. FHR Cat 1. CX essentially the same (4/70/-2).  Continue ^ Pitocin.

## 2018-10-04 NOTE — Anesthesia Preprocedure Evaluation (Addendum)
Anesthesia Evaluation  Patient identified by MRN, date of birth, ID band Patient awake    Reviewed: Allergy & Precautions, Patient's Chart, lab work & pertinent test results  History of Anesthesia Complications Negative for: history of anesthetic complications  Airway Mallampati: II  TM Distance: >3 FB Neck ROM: Full    Dental no notable dental hx.    Pulmonary former smoker,    Pulmonary exam normal        Cardiovascular negative cardio ROS Normal cardiovascular exam     Neuro/Psych negative neurological ROS     GI/Hepatic negative GI ROS, Neg liver ROS,   Endo/Other  negative endocrine ROS  Renal/GU negative Renal ROS     Musculoskeletal negative musculoskeletal ROS (+)   Abdominal   Peds  Hematology  (+) anemia , Hgb 9.5   Anesthesia Other Findings Day of surgery medications reviewed with the patient.  Reproductive/Obstetrics (+) Pregnancy                            Anesthesia Physical Anesthesia Plan  ASA: II  Anesthesia Plan: Epidural   Post-op Pain Management:    Induction:   PONV Risk Score and Plan: Treatment may vary due to age or medical condition  Airway Management Planned: Natural Airway  Additional Equipment:   Intra-op Plan:   Post-operative Plan:   Informed Consent: I have reviewed the patients History and Physical, chart, labs and discussed the procedure including the risks, benefits and alternatives for the proposed anesthesia with the patient or authorized representative who has indicated his/her understanding and acceptance.       Plan Discussed with:   Anesthesia Plan Comments:         Anesthesia Quick Evaluation

## 2018-10-04 NOTE — Progress Notes (Signed)
Vitals:   10/04/18 2231 10/04/18 2301  BP: 122/90 (!) 134/94  Pulse: 99 100  Resp: 18 17  Temp:    SpO2:     Comfortable w/epidural.  FHR Cat 1.  MUVs just now barely adequate (195). No change in cx.  Will try to get MVUs >200. Pitocin at 18 mu/min.

## 2018-10-04 NOTE — MAU Note (Signed)
Water broke at 11:45 am, clear.  No pain.  Baby moving. No bleeding.

## 2018-10-04 NOTE — Progress Notes (Signed)
OB/GYN Faculty Practice: Labor Progress Note  Subjective:  Patient doing well. No pain.   Objective:  BP 125/72   Pulse (!) 103   Temp 98.3 F (36.8 C) (Oral)   Resp 18   Ht 5\' 3"  (1.6 m)   Wt 69.9 kg   LMP 12/16/2017   SpO2 99%   BMI 27.28 kg/m  Gen: Lying in bed comfortably watching tv.  Extremities: Moving spontaneously. No swelling/erythema.   CE: Dilation: 3 Effacement (%): Thick Cervical Position: Middle Station: -3 Presentation: Vertex Exam by:: Dr. Maudie Mercury  FH:  BL 145 Mod  var, + a, -d.   Assessment and Plan:  Rita Allen is a 23 y.o. G2P1001 at [redacted]w[redacted]d - SROM at 11:45.   Labor: Progressing normally. Contractions irregular and non painful. Will start pitocin.  . Pain control: Epidural and IV pain meds . Anticipated MOD: NSVD . PPH Risk: Med (prior PPH)   Fetal Wellbeing: Cat I tracing . GBS negative . Continuous fetal monitoring  Zettie Cooley, M.D.  Family Medicine  PGY-1 10/04/2018 6:19 PM

## 2018-10-05 ENCOUNTER — Encounter (HOSPITAL_COMMUNITY): Payer: Self-pay | Admitting: Obstetrics & Gynecology

## 2018-10-05 DIAGNOSIS — Z3A39 39 weeks gestation of pregnancy: Secondary | ICD-10-CM

## 2018-10-05 LAB — RPR: RPR Ser Ql: NONREACTIVE

## 2018-10-05 LAB — ABO/RH: ABO/RH(D): A POS

## 2018-10-05 MED ORDER — DIBUCAINE (PERIANAL) 1 % EX OINT: 1.0000 "application " | TOPICAL_OINTMENT | CUTANEOUS | Status: DC | PRN

## 2018-10-05 MED ORDER — BENZOCAINE-MENTHOL 20-0.5 % EX AERO: 1.0000 "application " | INHALATION_SPRAY | CUTANEOUS | Status: DC | PRN

## 2018-10-05 MED ORDER — ACETAMINOPHEN 325 MG PO TABS
650.0000 mg | ORAL_TABLET | ORAL | Status: DC | PRN
  Administered 2018-10-05 – 2018-10-06 (×3): 650 mg via ORAL
  Filled 2018-10-05 (×3): qty 2

## 2018-10-05 MED ORDER — COCONUT OIL OIL: 1.0000 "application " | TOPICAL_OIL | Status: DC | PRN

## 2018-10-05 MED ORDER — SIMETHICONE 80 MG PO CHEW: 80.0000 mg | CHEWABLE_TABLET | ORAL | Status: DC | PRN

## 2018-10-05 MED ORDER — BISACODYL 10 MG RE SUPP: 10.0000 mg | Freq: Every day | RECTAL | Status: DC | PRN

## 2018-10-05 MED ORDER — MEASLES, MUMPS & RUBELLA VAC IJ SOLR: 0.5000 mL | Freq: Once | INTRAMUSCULAR | Status: DC

## 2018-10-05 MED ORDER — ONDANSETRON HCL 4 MG/2ML IJ SOLN: 4.0000 mg | INTRAMUSCULAR | Status: DC | PRN

## 2018-10-05 MED ORDER — DIPHENHYDRAMINE HCL 25 MG PO CAPS: 25.0000 mg | ORAL_CAPSULE | Freq: Four times a day (QID) | ORAL | Status: DC | PRN

## 2018-10-05 MED ORDER — LACTATED RINGERS IV SOLN: INTRAVENOUS | Status: DC

## 2018-10-05 MED ORDER — PRENATAL MULTIVITAMIN CH
1.0000 | ORAL_TABLET | Freq: Every day | ORAL | Status: DC
  Administered 2018-10-06 – 2018-10-07 (×2): 1 via ORAL
  Filled 2018-10-05 (×3): qty 1

## 2018-10-05 MED ORDER — LACTATED RINGERS AMNIOINFUSION
INTRAVENOUS | Status: DC
  Administered 2018-10-05: 03:00:00 via INTRAUTERINE

## 2018-10-05 MED ORDER — GENTAMICIN SULFATE 40 MG/ML IJ SOLN
5.0000 mg/kg | INTRAVENOUS | Status: DC
  Administered 2018-10-05: 350 mg via INTRAVENOUS
  Filled 2018-10-05: qty 8.75

## 2018-10-05 MED ORDER — IBUPROFEN 600 MG PO TABS
600.0000 mg | ORAL_TABLET | Freq: Four times a day (QID) | ORAL | Status: DC
  Administered 2018-10-05 – 2018-10-07 (×8): 600 mg via ORAL
  Filled 2018-10-05 (×9): qty 1

## 2018-10-05 MED ORDER — DOCUSATE SODIUM 100 MG PO CAPS
100.0000 mg | ORAL_CAPSULE | Freq: Two times a day (BID) | ORAL | Status: DC
  Administered 2018-10-05 – 2018-10-07 (×4): 100 mg via ORAL
  Filled 2018-10-05 (×4): qty 1

## 2018-10-05 MED ORDER — WITCH HAZEL-GLYCERIN EX PADS: 1.0000 "application " | MEDICATED_PAD | CUTANEOUS | Status: DC | PRN

## 2018-10-05 MED ORDER — SODIUM CHLORIDE 0.9 % IV SOLN
2.0000 g | Freq: Four times a day (QID) | INTRAVENOUS | Status: DC
  Administered 2018-10-05: 2 g via INTRAVENOUS
  Filled 2018-10-05 (×4): qty 2000

## 2018-10-05 MED ORDER — TETANUS-DIPHTH-ACELL PERTUSSIS 5-2.5-18.5 LF-MCG/0.5 IM SUSP: 0.5000 mL | Freq: Once | INTRAMUSCULAR | Status: DC

## 2018-10-05 MED ORDER — ONDANSETRON HCL 4 MG PO TABS: 4.0000 mg | ORAL_TABLET | ORAL | Status: DC | PRN

## 2018-10-05 NOTE — Progress Notes (Signed)
Vitals:   10/05/18 0201 10/05/18 0232  BP: 122/74 104/62  Pulse: (!) 114 85  Resp: 16 16  Temp:    SpO2:     Comfortable w/epidural.  Ctx pattern now dysfunctional, having runs of 3 back to back ctx q 4-5 minutes.  During the last 3 runs, FHR decels (variable/late), with return to baseline and moderate variability in between. Pitocin at 30 mu/min. No change in cervix (4/70/-2).  Leaking clear amniotic fluid.  Has had IVF bolus and multiple position changes.  Will decrease pitocin to 10 mu/min and hopefully be able to resume increasing it once runs of tachysystole resolve.

## 2018-10-05 NOTE — Discharge Summary (Signed)
Postpartum Discharge Summary  Patient Name: Rita Allen DOB: Nov 05, 1995 MRN: [id number]  Date of admission: 10/04/2018 Delivering Provider: Woodroe Mode   Date of discharge: 10/07/2018  Admitting diagnosis: WATER BROKE Intrauterine pregnancy: [redacted]w[redacted]d    Secondary diagnosis:  Active Problems:   Short interval between pregnancies affecting pregnancy in second trimester, antepartum   Late prenatal care   History of shoulder dystocia in prior pregnancy   History of postpartum hemorrhage   Intrauterine pregnancy  Additional problems: history of shoulder dystocia and PPH with last baby (2019) Discharge diagnosis: Term Pregnancy Delivered                                                     Postpartum procedures:Need PP MMR Augmentation: Pitocin Complications: 2 min shoulder dystocia, infant to NICU for short period of time  Hospital course: SROM with augmentation of labor  23y.o. yo G2P1001 at 339w6das admitted to the hospital 10/04/2018 for SROM. Patient had an labor course complicated by 2 min shoulder dystocia (see Dr. ArRoselie Awkwardelivery note).  Membrane Rupture Time/Date: 11:45 AM ,10/04/2018   Intrapartum Procedures: Episiotomy: None [1]                                         Lacerations:  None [1]  Patient had delivery of a Viable infant.  Information for the patient's newborn:  FrNikoleta, Dady0[[id number]]Delivery Method: Vaginal, Spontaneous(Filed from Delivery Summary)    10/05/2018  Details of delivery can be found in separate delivery note.  Patient had a routine postpartum course. Patient is discharged home 10/07/18.  Magnesium Sulfate recieved: No BMZ received: No  Physical exam  Vitals:   10/06/18 0818 10/06/18 1443 10/06/18 2149 10/07/18 0552  BP: 111/74 127/70 127/83 111/87  Pulse: 71 77 73 75  Resp: 16 17 17 18   Temp: 98.3 F (36.8 C) 98.3 F (36.8 C) 98.2 F (36.8 C) 98.3 F (36.8 C)  TempSrc: Oral Oral Oral Oral  SpO2: 100% 100%  96%  Weight:       Height:       General: alert, well-appearing, NAD Lochia: appropriate Uterine Fundus: firm Incision: N/A DVT Evaluation: No significant calf/ankle edema  Labs: Lab Results  Component Value Date   WBC 17.3 (H) 10/06/2018   HGB 8.2 (L) 10/06/2018   HCT 25.8 (L) 10/06/2018   MCV 88.4 10/06/2018   PLT 168 10/06/2018   No flowsheet data found.  Discharge instruction: per After Visit Summary and "Baby and Me Booklet".  After visit meds:  Allergies as of 10/07/2018   No Known Allergies     Medication List    TAKE these medications   acetaminophen 325 MG tablet Commonly known as: Tylenol Take 2 tablets (650 mg total) by mouth every 4 (four) hours as needed (for pain scale < 4).   docusate sodium 100 MG capsule Commonly known as: COLACE Take 1 capsule (100 mg total) by mouth 2 (two) times daily.   ibuprofen 800 MG tablet Commonly known as: ADVIL Take 1 tablet (800 mg total) by mouth 3 (three) times daily.   PRENATAL VITAMIN PO Take by mouth.       Diet: routine diet Activity: Advance as tolerated. Pelvic  rest for 6 weeks.   Follow up Appt:No future appointments. Follow up Visit: Follow-up Information    [facility]. Schedule an appointment as soon as possible for a visit.   Specialty: Obstetrics and Gynecology Why: You should receive a call to schedule a postpartum follow-up visit. If you do not, please call clinic to make a follow-up appointment in 4-6 weeks.  Contact information: [address] [place] [phone]        Please schedule this patient for Postpartum visit in: 4 weeks with the following provider: Any provider For C/S patients schedule nurse incision check in weeks 2 weeks: no Low risk pregnancy complicated by: nothing.  Delivery mode:  SVD Anticipated Birth Control:  IUD PP Procedures needed: none  Schedule Integrated BH visit: no   Newborn Data: Live born female  Birth Weight: 9 lb 7 oz  (4280 g) APGAR: 2, 8  Newborn Delivery   Birth date/time: 10/05/2018 15:42:00 Delivery type: Vaginal, Spontaneous     Baby Feeding: Both Breast and Bottle Disposition:home with mother   10/07/2018 Glenice Bow, DO

## 2018-10-05 NOTE — Progress Notes (Signed)
Pharmacy Antibiotic Note  Rita Allen is a 23 y.o. female admitted on 10/04/2018 with Chorioamnionitis. Pharmacy has been consulted for gentamicin dosing.  Plan: Gentamicin 5mg /kg (350mg ) IV q24h Follow renal function and obtain levels as needed  Height: 5\' 3"  (160 cm) Weight: 154 lb (69.9 kg) IBW/kg (Calculated) : 52.4  Temp (24hrs), Avg:98.6 F (37 C), Min:98.2 F (36.8 C), Max:99.6 F (37.6 C)  Recent Labs  Lab 10/04/18 1448  WBC 9.8    CrCl cannot be calculated (No successful lab value found.).    No Known Allergies   Thank you for allowing pharmacy to be a part of this patient's care.  Dolly Rias RPh 10/05/2018, 8:18 AM Pager [phone]

## 2018-10-05 NOTE — Progress Notes (Addendum)
   Ashelyn Hukill is a 23 y.o. G2P1001 at [redacted]w[redacted]d  admitted for induction of labor due to SROM  At 2 pm on 10-04-2018.  Subjective: Comfortable with epidural; feels no urge to push.   Objective: Vitals:   10/05/18 0930 10/05/18 1000 10/05/18 1030 10/05/18 1100  BP: (!) 119/59 119/60 121/71 135/83  Pulse: (!) 113 (!) 112 (!) 113 (!) 107  Resp: 18 18 18 18   Temp:      TempSrc:      SpO2: 97% 98% 97% 98%  Weight:      Height:       No intake/output data recorded.  FHT:  FHR: 145 bpm, variability: moderate,  accelerations:  Present,  decelerations:  Present occasional variables UC:   irregular, every  3 minutes SVE:   Dilation: (P) 10 Effacement (%): 100 Station: -2 Exam by:: Shela Nevin, RN Pitocin @ 91mu/min  Labs: Lab Results  Component Value Date   WBC 9.8 10/04/2018   HGB 9.5 (L) 10/04/2018   HCT 31.0 (L) 10/04/2018   MCV 89.3 10/04/2018   PLT 194 10/04/2018    Assessment / Plan: now 10 cm; patient wants to sleep and wait for her husband to come back.  Practice push went well.  MVU have been betwene 110-190; pitocin up to 8. Continue to increase pitocin but will D/C amnioinfusion for now.  Labor: now fully dilated; no urge to push.  Fetal Wellbeing:  Category I Pain Control:  Epidural Anticipated MOD:  NSVD  Mervyn Skeeters Lativia Velie 10/05/2018, 12:00 PM

## 2018-10-05 NOTE — Progress Notes (Signed)
Ctx pattern much improved, no more tripling and ctx are q 3-4 minutes (MVUs not adequate).  However, FHR w/variable decels (possibly early decels) w/each ctx.  Return to baseline and moderate variability in between and +  Accels.  Will treat as if variables and start amnioinfusion.

## 2018-10-05 NOTE — Progress Notes (Signed)
Vitals:   10/05/18 0631 10/05/18 0701  BP: 118/75 118/83  Pulse: 97 (!) 112  Resp: 16 17  Temp: 99.6 F (37.6 C)   SpO2: 100%    FHR 160, moderate variability, + accels, occ mild variable. MVUs 150.  Pitocin had been d/c'd after some late decels around 0400, restarted after remaining Cat 1 for an hour +.  Now at 2 mu/min.  Cx 7/100/-2.  Continue to monitor temp, treat w/abx/tylenol if suspect Triple I.  Hopeful for SVD if labor can become adequate once again.

## 2018-10-06 LAB — CBC
HCT: 25.8 % — ABNORMAL LOW (ref 36.0–46.0)
Hemoglobin: 8.2 g/dL — ABNORMAL LOW (ref 12.0–15.0)
MCH: 28.1 pg (ref 26.0–34.0)
MCHC: 31.8 g/dL (ref 30.0–36.0)
MCV: 88.4 fL (ref 80.0–100.0)
Platelets: 168 10*3/uL (ref 150–400)
RBC: 2.92 MIL/uL — ABNORMAL LOW (ref 3.87–5.11)
RDW: 15.4 % (ref 11.5–15.5)
WBC: 17.3 10*3/uL — ABNORMAL HIGH (ref 4.0–10.5)
nRBC: 0 % (ref 0.0–0.2)

## 2018-10-06 MED ORDER — FERROUS FUMARATE 324 (106 FE) MG PO TABS
1.0000 | ORAL_TABLET | Freq: Two times a day (BID) | ORAL | Status: DC
  Administered 2018-10-06 – 2018-10-07 (×3): 106 mg via ORAL
  Filled 2018-10-06 (×3): qty 1

## 2018-10-06 NOTE — Anesthesia Postprocedure Evaluation (Signed)
Anesthesia Post Note  Patient: Makayle Krahn  Procedure(s) Performed: AN AD [place]     Patient location during evaluation: Mother Baby Anesthesia Type: Epidural Level of consciousness: awake and alert Pain management: pain level controlled Vital Signs Assessment: post-procedure vital signs reviewed and stable Respiratory status: spontaneous breathing, nonlabored ventilation and respiratory function stable Cardiovascular status: stable Postop Assessment: no headache, no backache, epidural receding, no apparent nausea or vomiting, patient able to bend at knees, adequate PO intake and able to ambulate Anesthetic complications: no    Last Vitals:  Vitals:   10/06/18 0536 10/06/18 0818  BP: 121/78 111/74  Pulse: 72 71  Resp: 14 16  Temp: 36.4 C 36.8 C  SpO2: 100% 100%    Last Pain:  Vitals:   10/06/18 1216  TempSrc:   PainSc: 2    Pain Goal: Patients Stated Pain Goal: 8 (10/04/18 1349)                 Laura Radilla Hristova

## 2018-10-06 NOTE — Lactation Note (Signed)
This note was copied from a baby's chart. Lactation Consultation Note  Patient Name: Rita Allen JSHFW'Y Date: 10/06/2018 Reason for consult: Initial assessment;Term;NICU baby P2, 62 hour female infant, LGA greater than 9 lbs, infant is in NICU. Mom's feeding choice at admission was breast and formula feeding. Per mom, she tried breastfeed her one year old daughter but stopped due never seeing colostrum until infant was 71 weeks old and she was engorged.  Per mom, she doesn't have breast pump at home, [facility] showed mom how to use cylinder hand pump in her DEBP kit. Mom plans to use DEBP every 3 hours for 15 minutes on initial setting.  Mom taught back hand expression and expressed 2 ml of colostrum, Nurse will give labels for breast milk to be taken to NICU. Mom knows once infant is able to latch at breast to breastfeed according hunger cues, 8 to 12 times within 24 hours and on demand. Mom knows to call Nurse or [place] if she has any questions, concerns or need assistance with latching infant to breast.  Mom made aware of O/P services, breastfeeding support groups, community resources, and our phone # for post-discharge questions.   Maternal Data Formula Feeding for Exclusion: Yes Reason for exclusion: Mother's choice to formula and breast feed on admission Has patient been taught Hand Expression?: Yes(Mom hand expressed 2 ml of colostrum to take to NICU.)  Feeding    LATCH Score                   Interventions Interventions: Breast feeding basics reviewed;Breast compression;DEBP;Hand express;Breast massage;Expressed milk  Lactation Tools Discussed/Used WIC Program: No Pump Review: Setup, frequency, and cleaning;Milk Storage Initiated by:: Vicente Serene, IBCLC Date initiated:: 10/06/18   Consult Status Consult Status: Follow-up Date: 10/06/18 Follow-up type: In-patient    Vicente Serene 10/06/2018, 12:04 AM

## 2018-10-06 NOTE — Progress Notes (Signed)
Post Partum Day #1 Subjective: up ad lib, tolerating PO and having some upper leg weakness, but able to bear weight; no dizziness with ambulation  Pt seen in the NICU where she was attempting to bottle feed the baby, whom she states is improving; currently not working on breastfeeding; considering outpt IUD placement for contraception  Objective: Blood pressure 111/74, pulse 71, temperature 98.3 F (36.8 C), temperature source Oral, resp. rate 16, height 5\' 3"  (1.6 m), weight 69.9 kg, last menstrual period 12/16/2017, SpO2 100 %, unknown if currently breastfeeding.  Physical Exam:  General: alert, cooperative and no distress Lochia: appropriate Uterine Fundus: firm DVT Evaluation: No evidence of DVT seen on physical exam.  Recent Labs    10/04/18 1448 10/06/18 0512  HGB 9.5* 8.2*  HCT 31.0* 25.8*    Assessment/Plan: Plan for discharge tomorrow from our service- this was explained to the pt and she is agreeable  -Ferrous fumarate bid started 2/2 anemia    LOS: 2 days   Myrtis Ser [name] 10/06/2018, 9:19 AM

## 2018-10-06 NOTE — Progress Notes (Signed)
Pt requested for a lactation consultant to not come back to the room. She stated she was trying to pump when she could but she is feeding baby a bottle and does not want to be seen again by [facility]

## 2018-10-06 NOTE — Clinical Social Work Maternal (Addendum)
CLINICAL SOCIAL WORK MATERNAL/CHILD NOTE  Patient Details  Name: Rita Allen MRN: [id number] Date of Birth: 03/27/1996  Date:  10-07-2018  Clinical Social Worker Initiating Note:  Elijio Miles Date/Time: Initiated:  May 12, 2018/1200     Child's Name:  Rita Allen   Biological Parents:  Mother, Father(Rita Allen and Rita Allen DOB: 01/23/1992)   Need for Interpreter:  None   Reason for Referral:  Current Substance Use/Substance Use During Pregnancy , Other (Comment)(NICU admission due to shoulder dystocia)   Address:  [place] [address]    Phone number:  [phone] (home)     Additional phone number:   Household Members/Support Persons (HM/SP):   Household Member/Support Person 1, Household Member/Support Person 2   HM/SP Name Relationship DOB or Age  HM/SP -1   Brother    HM/SP -2 Rita Allen Daughter 10/11/17  HM/SP -3        HM/SP -4        HM/SP -5        HM/SP -6        HM/SP -7        HM/SP -8          Natural Supports (not living in the home):  Parent   Professional Supports: None   Employment: Part-time   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   Homebound arranged:    Museum/gallery curator Resources:  Medicaid   Other Resources:      Cultural/Religious Considerations Which May Impact Care:    Strengths:  Ability to meet basic needs , Home prepared for child , Pediatrician chosen   Psychotropic Medications:         Pediatrician:    [place]  Pediatrician List:   [place]      Pediatrician Fax Number:    Risk Factors/Current Problems:  Substance Use    Cognitive State:  Alert , Able to Concentrate , Linear Thinking    Mood/Affect:  Calm , Comfortable , Relaxed    CSW Assessment: CSW received consult for NICU admission and history of THC use.  CSW  met with MOB to offer support and complete assessment.    MOB and FOB sitting at bedside, when CSW entered the room. CSW introduced self and received verbal permission from MOB to complete assessment with FOB present. CSW explained reason for consult to which MOB and FOB expressed understanding. MOB and FOB both engaged throughout assessment. Per MOB, she currently lives with her brother. MOB shared that they have a 33-year-old daughter who, according to MOB, spends most of her time at [facility] ([address] [place], [place]). MOB reported that she and infant would be staying with FOB in his home so that he can help with recovery process. MOB reported she currently works part-time at [employer] in [place], [place].   [place] inquired about MOB's mental health history and MOB denied having any history of mental health concerns and denied any PPD with her daughter. CSW provided education regarding the baby blues period vs. perinatal mood disorders, discussed treatment and gave resources for mental health follow up if concerns arise.  CSW recommends self-evaluation during the postpartum time period using the New Mom Checklist from Postpartum Progress and encouraged MOB to contact a medical professional if symptoms are noted at any time. MOB  denied any current SI or HI and reported having good support from FOB and her mother.  CSW inquired about MOB's substance use during pregnancy and MOB acknowledged trying it one time prior to finding out she was pregnant and denied any usage after. CSW informed MOB of Hospital Drug Policy and explained UDS came back negative but that CDS would continue to be monitored and a CPS report would be made, if warranted. MOB and FOB denied any questions or concerns regarding policy.   MOB and FOB reported having all essential items for infant once discharged and stated infant would be sleeping in a basinet once home. CSW provided review of Sudden Infant Death Syndrome (SIDS)  precautions and safe sleeping habits.     CSW inquired about MOB and FOB's feelings regarding NICU admission. Per MOB, her daughter also experienced shoulder dystocia but did not require NICU admission. MOB and FOB expressed some frustrations with visitation policies in the NICU and not being able to see infant together. CSW validated MOB and FOB's concerns and explained the extra precautions due to nature of the unit. MOB reported she plans to stay with infant until he is discharged as they live an hour away. CSW informed MOB about the NICU, what to expect and resources/supports available while infant is admitted to the NICU.   CSW will continue to offer support and resources to family while infant remains in NICU.   CSW Plan/Description:  No Further Intervention Required/No Barriers to Discharge, Psychosocial Support and Ongoing Assessment of Needs, Sudden Infant Death Syndrome (SIDS) Education, Perinatal Mood and Anxiety Disorder (PMADs) Education, [place], CSW Will Continue to Monitor Umbilical Cord Tissue Drug Screen Results and Make Report if Foye Spurling, [place] 22-Oct-2018, 2:29 PM

## 2018-10-07 MED ORDER — IBUPROFEN 800 MG PO TABS: 800.0000 mg | ORAL_TABLET | Freq: Three times a day (TID) | ORAL | 0 refills | Status: AC

## 2018-10-07 MED ORDER — DOCUSATE SODIUM 100 MG PO CAPS: 100.0000 mg | ORAL_CAPSULE | Freq: Two times a day (BID) | ORAL | 0 refills | Status: AC

## 2018-10-07 MED ORDER — ACETAMINOPHEN 325 MG PO TABS: 650.0000 mg | ORAL_TABLET | ORAL | Status: AC | PRN

## 2018-10-07 NOTE — Progress Notes (Signed)
Attending Circumcision Counseling Progress Note  Patient desires circumcision for her female infant.  Circumcision procedure details discussed, risks and benefits of procedure were also discussed.  These include but are not limited to: Benefits of circumcision in men include reduction in the rates of urinary tract infection (UTI), penile cancer, some sexually transmitted infections, penile inflammatory and retractile disorders, as well as easier hygiene.  Risks include bleeding , infection, injury of glans which may lead to penile deformity or urinary tract issues, unsatisfactory cosmetic appearance and other potential complications related to the procedure.  It was emphasized that this is an elective procedure.  Patient wants to proceed with circumcision; written informed consent obtained.  Will do circumcision soon, routine circumcision and post circumcision care ordered for the infant.  Verita Schneiders, M.D. 10/07/2018 9:15 AM

## 2018-10-08 ENCOUNTER — Encounter: Payer: Medicaid Other | Admitting: Adult Health

## 2018-10-08 ENCOUNTER — Ambulatory Visit (HOSPITAL_COMMUNITY): Payer: Medicaid Other

## 2018-10-08 LAB — TYPE AND SCREEN
ABO/RH(D): A POS
Antibody Screen: NEGATIVE
Unit division: 0
Unit division: 0

## 2018-10-08 LAB — BPAM RBC
Blood Product Expiration Date: 202007052359
Blood Product Expiration Date: 202007052359
Unit Type and Rh: 6200
Unit Type and Rh: 6200

## 2018-10-16 ENCOUNTER — Encounter: Payer: Self-pay | Admitting: *Deleted

## 2018-11-08 ENCOUNTER — Ambulatory Visit: Payer: Medicaid Other | Admitting: Advanced Practice Midwife

## 2018-12-20 ENCOUNTER — Ambulatory Visit: Payer: Medicaid Other | Admitting: Women's Health

## 2018-12-21 ENCOUNTER — Ambulatory Visit: Payer: Medicaid Other | Admitting: Women's Health

## 2019-03-11 ENCOUNTER — Encounter: Payer: Self-pay | Admitting: Advanced Practice Midwife

## 2019-03-11 ENCOUNTER — Encounter: Payer: Self-pay | Admitting: *Deleted

## 2019-04-18 IMAGING — US US MFM FETAL BPP W/O NON-STRESS
1 series · 16 of 23 positions shown · non-contrast
Comparison: none

[Series 1: us mfm fetal bpp w/o non-stress · 23 acquisitions, 16 frames shown]
[im 1/23]
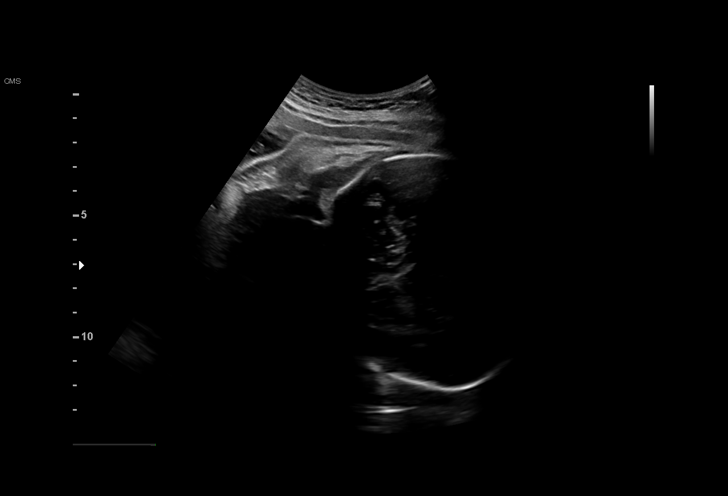
[im 3/23]
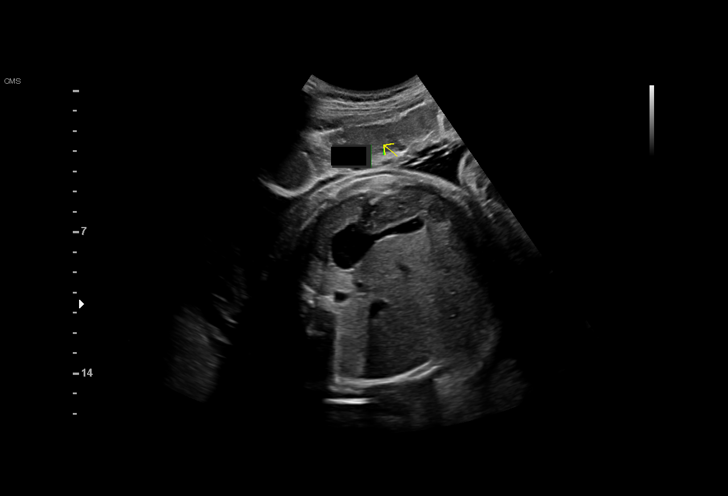
[im 4/23]
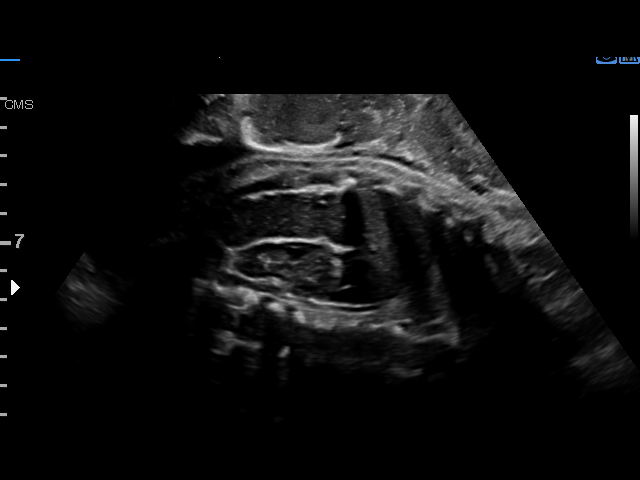
[im 6/23]
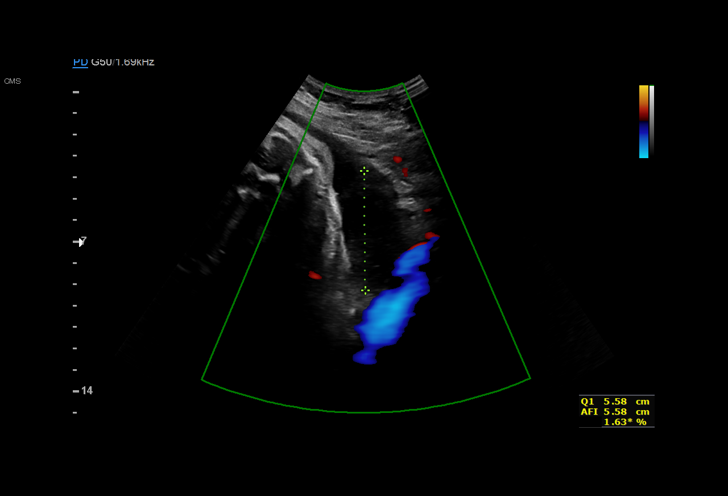
[im 7/23]
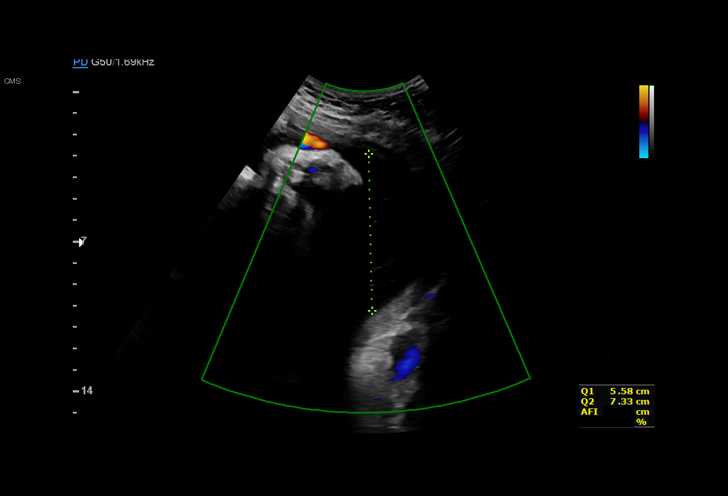
[im 8/23]
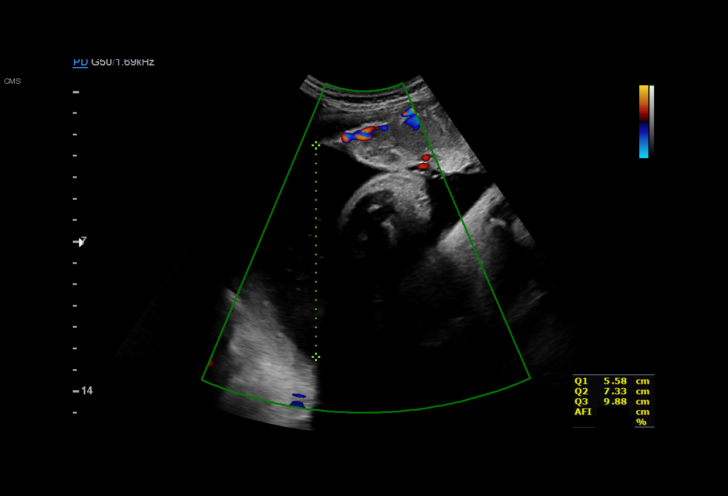
[im 10/23]
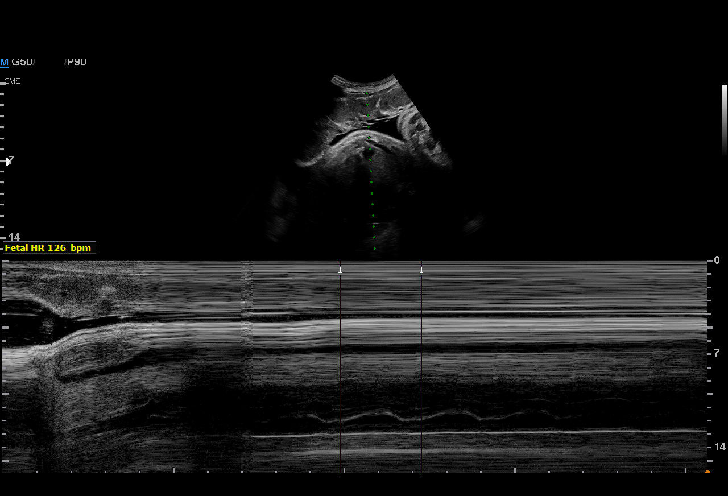
[im 11/23]
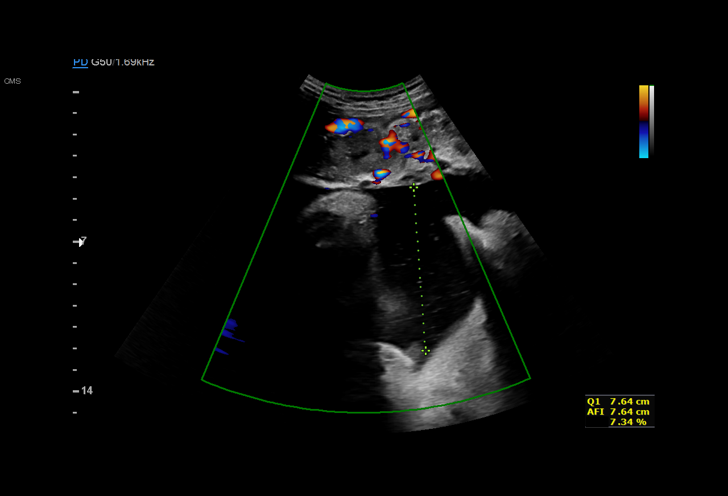
[im 13/23]
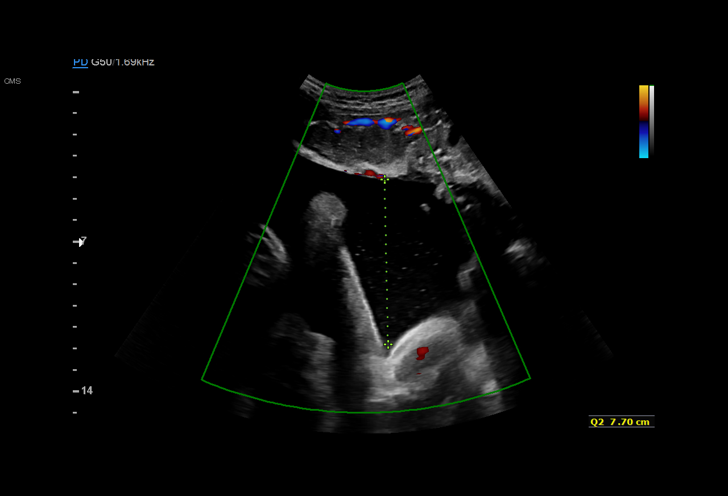
[im 14/23]
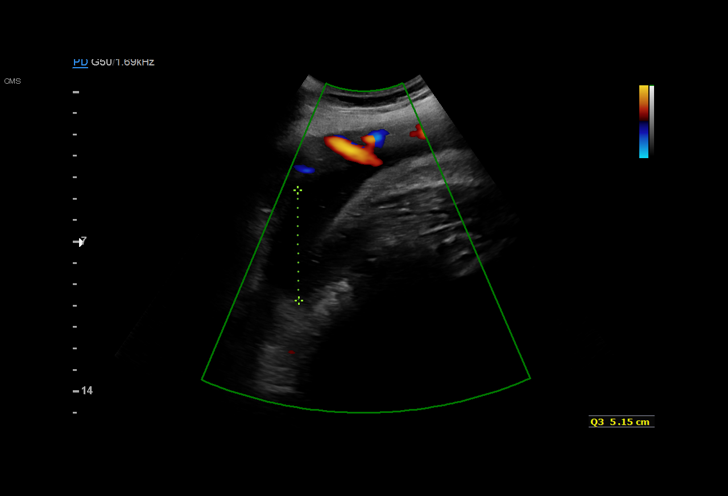
[im 16/23]
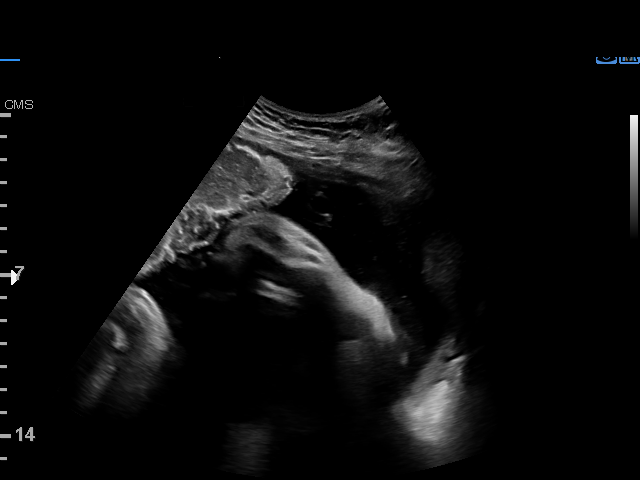
[im 17/23]
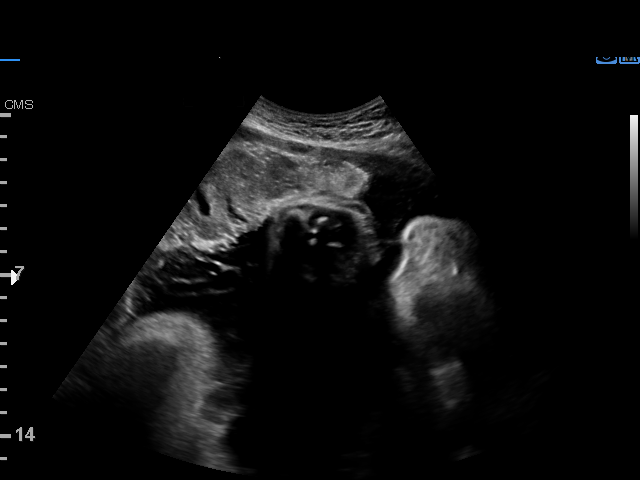
[im 18/23]
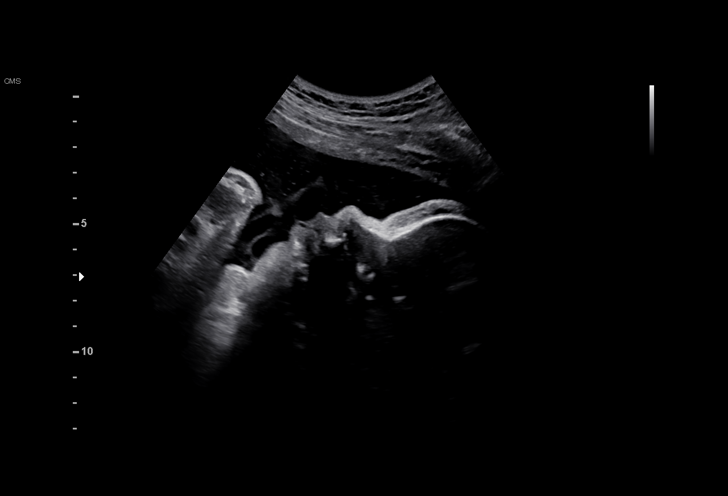
[im 20/23]
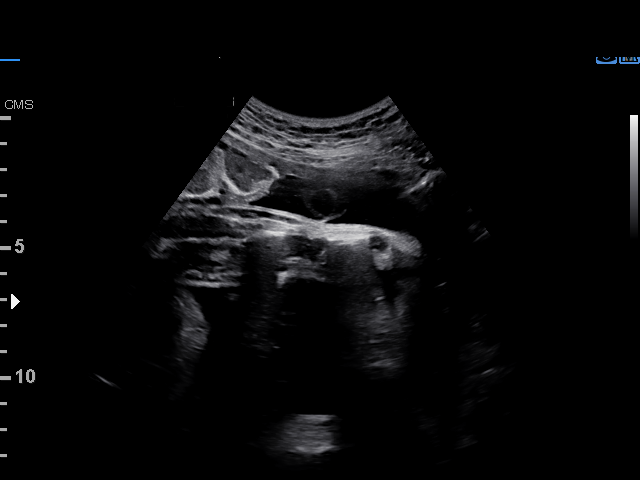
[im 21/23]
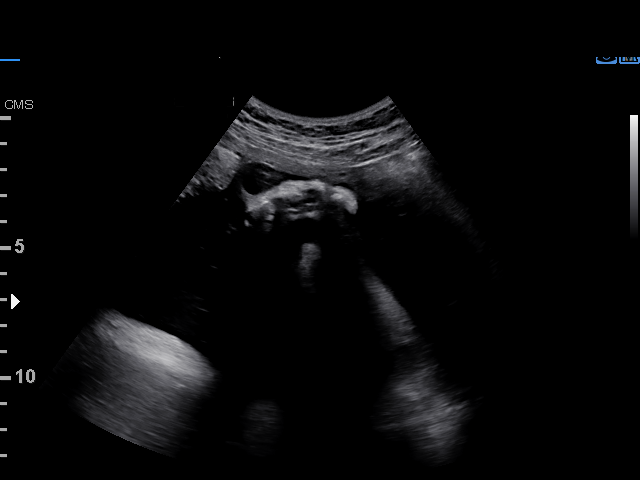
[im 23/23]
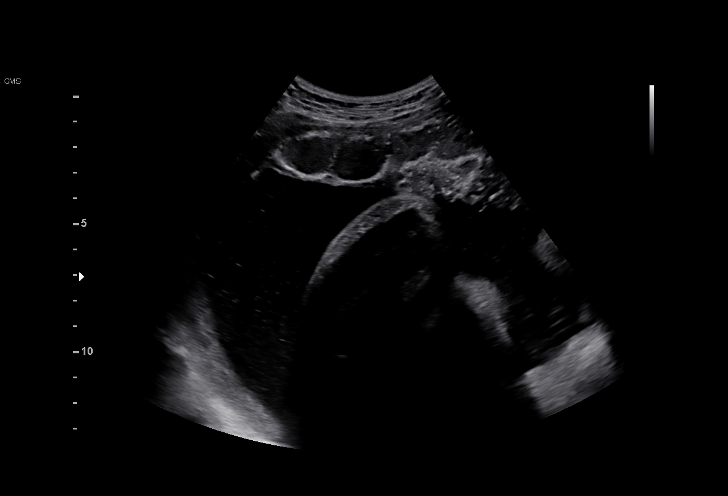

[16 of 23 positions shown; findings below may reference images not displayed]

1  RISMA                  [id number]      [id number]     [id number]
DOGLIOTTI
CABDISALAN
Indications

40 weeks gestation of pregnancy
Postdate pregnancy (40-42 weeks)
Polyhydramnios, third trimester, antepartum
condition or complication, unspecified fetus
OB History

Gravidity:    1         Term:   0        Prem:   0        SAB:   0
TOP:          0       Ectopic:  0        Living: 0
Fetal Evaluation

Num Of Fetuses:     1
Fetal Heart         126
Rate(bpm):
Cardiac Activity:   Observed
Presentation:       Cephalic

Amniotic Fluid
AFI FV:      Polyhydramnios

AFI Sum(cm)     %Tile       Largest Pocket(cm)
29.05           > 97

RUQ(cm)       RLQ(cm)       LUQ(cm)        LLQ(cm)
5.58
Biophysical Evaluation

Amniotic F.V:   Polyhydramnios             F. Tone:        Observed
F. Movement:    Observed                   Score:          [DATE]
F. Breathing:   Observed
Gestational Age

Clinical EDD:  40w 3d                                        EDD:   10/06/17
Best:          40w 3d     Det. By:  Clinical EDD             EDD:   10/06/17
Impression

Intrauterine pregnancy at 40+3 weeks with polyhydramnios
Elevated amniotic fluid with AFI 29.1 cm
BPP [DATE]
Recommendations

For admission today; continue clinical evaluation and
management.
